# Patient Record
Sex: Male | Born: 1975 | Race: White | Hispanic: No | Marital: Married | Smoking: Never smoker
Health system: Southern US, Community
[De-identification: ages and names within clinical notes are randomized; demographics above are authoritative.]

## PROBLEM LIST (undated history)

## (undated) DIAGNOSIS — J45909 Unspecified asthma, uncomplicated: Secondary | ICD-10-CM

## (undated) DIAGNOSIS — T7840XA Allergy, unspecified, initial encounter: Secondary | ICD-10-CM

## (undated) DIAGNOSIS — F419 Anxiety disorder, unspecified: Secondary | ICD-10-CM

## (undated) DIAGNOSIS — Z86010 Personal history of colonic polyps: Secondary | ICD-10-CM

## (undated) HISTORY — PX: COLONOSCOPY: SHX174

## (undated) HISTORY — DX: Personal history of colonic polyps: Z86.010

## (undated) HISTORY — PX: POLYPECTOMY: SHX149

## (undated) HISTORY — PX: BUNIONECTOMY: SHX129

## (undated) HISTORY — DX: Unspecified asthma, uncomplicated: J45.909

## (undated) HISTORY — DX: Anxiety disorder, unspecified: F41.9

## (undated) HISTORY — DX: Allergy, unspecified, initial encounter: T78.40XA

---

## 2013-06-24 HISTORY — PX: COLONOSCOPY W/ POLYPECTOMY: SHX1380

## 2014-03-22 ENCOUNTER — Encounter: Payer: Self-pay | Admitting: Internal Medicine

## 2014-05-24 ENCOUNTER — Encounter: Payer: Self-pay | Admitting: Internal Medicine

## 2014-05-24 ENCOUNTER — Ambulatory Visit (INDEPENDENT_AMBULATORY_CARE_PROVIDER_SITE_OTHER): Payer: Managed Care, Other (non HMO) | Admitting: Internal Medicine

## 2014-05-24 VITALS — BP 112/76 | HR 80 | Ht 70.5 in | Wt 198.0 lb

## 2014-05-24 DIAGNOSIS — Z8371 Family history of colonic polyps: Secondary | ICD-10-CM

## 2014-05-24 DIAGNOSIS — K648 Other hemorrhoids: Secondary | ICD-10-CM

## 2014-05-24 DIAGNOSIS — Z1211 Encounter for screening for malignant neoplasm of colon: Secondary | ICD-10-CM

## 2014-05-24 DIAGNOSIS — Z83719 Family history of colon polyps, unspecified: Secondary | ICD-10-CM

## 2014-05-24 NOTE — Progress Notes (Signed)
Referred by Dr. Ardeth Perfect Subjective:    Patient ID: Spencer Wade, male    DOB: 01-Oct-1975, 38 y.o.   MRN: [id number]  HPI The patient is a pleasant 38 year old white man who has a father that had precancerous polyps diagnosed in his 40s and a sister who is had 1 or 2 colonoscopies and is only 76 and is certain she has had precancerous colon polyps. He is here to discuss indications for colonoscopy. He has no active GI symptoms but about a year ago he had anal itching and bright red blood on the toilet paper. He has undertaken mountain biking in the past couple of years. He does not sit for prolonged periods of time on the toilet and denies straining or constipation problems. His GI review of systems is otherwise negative. There is no strong family history of uterine, prostate or breast cancer. No Known Allergies Current outpatient prescriptions: AMBULATORY NON FORMULARY MEDICATION, Allergy injections Once weekly, Disp: , Rfl: ;  escitalopram (LEXAPRO) 10 MG tablet, Take 10 mg by mouth at bedtime. , Disp: , Rfl: ;  temazepam (RESTORIL) 15 MG capsule, Take 15 mg by mouth at bedtime. , Disp: , Rfl:   Past Medical History  Diagnosis Date  . Asthma   . Anxiety    Past Surgical History  Procedure Laterality Date  . Bunionectomy Right    History   Social History  . Marital Status: Legally Separated    Spouse Name: N/A    Number of Children: 0       Occupational History  . financial advisor    Social History Main Topics  . Smoking status: Never Smoker   . Smokeless tobacco: Never Used  . Alcohol Use: 0.0 oz/week    0 Not specified per week     Comment: wine with dinner several nights per week  . Drug Use: No  . Sexual Activity: None   Other Topics Concern  . None   Social History Narrative   05/24/2014 updated   Currently separated, he is a Music therapist, one caffeinated beverage daily.      Family History  Problem Relation Age of Onset  . Lung cancer Paternal Grandfather    . Lymphoma Paternal Grandmother   . Heart failure Paternal Grandmother   . Hypertension Paternal Grandmother   . Macular degeneration Paternal Grandmother   . Alzheimer's disease Paternal Grandfather   . Lung cancer Paternal Grandmother   . Stroke Paternal Grandmother   . Hypertension Father   . Colon polyps Father   . Hyperlipidemia Mother   . Thyroid disease Mother   . Colon polyps Sister   . Asthma Sister    Review of Systems Positive for some anxiety and insomnia. Otherwise as per history of present illness.    Objective:   Physical Exam General:  NAD Eyes:   anicteric Lungs:  clear Heart:  S1S2 no rubs, murmurs or gallops Abdomen:  soft and nontender, BS+ Ext:   no edema    Assessment & Plan:   1. Family history of colonic polyps   2. Colon cancer screening   3. Hemorrhoids, internal, with bleeding    1. Schedule a screening colonoscopy. I think this is sensible. I've asked him to try to obtain records from his sister and possibly his father though history of precancerous polyps in them in their 80s or 40s is convincing to me.The risks and benefits as well as alternatives of endoscopic procedure(s) have been discussed and reviewed. All questions answered.  The patient agrees to proceed. 2. His clinical scenario of prior rectal bleeding and anal itching is compatible with internal hemorrhoids. Will examine for other lesions at his screening colonoscopy.  I appreciate the opportunity to care for this patient. CC: Velna Hatchet, MD

## 2014-05-24 NOTE — Patient Instructions (Signed)
You have been scheduled for a colonoscopy. Please follow written instructions given to you at your visit today.  Please pick up your prep supplies at the pharmacy. If you use inhalers (even only as needed), please bring them with you on the day of your procedure. Your physician has requested that you go to www.startemmi.com and enter the access code given to you at your visit today. This web site gives a general overview about your procedure. However, you should still follow specific instructions given to you by our office regarding your preparation for the procedure.   I appreciate the opportunity to care for you.  

## 2014-05-30 ENCOUNTER — Encounter: Payer: Self-pay | Admitting: Internal Medicine

## 2014-06-09 ENCOUNTER — Ambulatory Visit (AMBULATORY_SURGERY_CENTER): Payer: Managed Care, Other (non HMO) | Admitting: Internal Medicine

## 2014-06-09 ENCOUNTER — Encounter: Payer: Self-pay | Admitting: Internal Medicine

## 2014-06-09 VITALS — BP 114/73 | HR 51 | Temp 97.7°F | Resp 15 | Ht 70.0 in | Wt 198.0 lb

## 2014-06-09 DIAGNOSIS — Z1211 Encounter for screening for malignant neoplasm of colon: Secondary | ICD-10-CM

## 2014-06-09 DIAGNOSIS — Z83719 Family history of colon polyps, unspecified: Secondary | ICD-10-CM

## 2014-06-09 DIAGNOSIS — Z8371 Family history of colonic polyps: Secondary | ICD-10-CM

## 2014-06-09 DIAGNOSIS — D128 Benign neoplasm of rectum: Secondary | ICD-10-CM

## 2014-06-09 DIAGNOSIS — D129 Benign neoplasm of anus and anal canal: Secondary | ICD-10-CM

## 2014-06-09 DIAGNOSIS — D125 Benign neoplasm of sigmoid colon: Secondary | ICD-10-CM

## 2014-06-09 DIAGNOSIS — D124 Benign neoplasm of descending colon: Secondary | ICD-10-CM

## 2014-06-09 MED ORDER — SODIUM CHLORIDE 0.9 % IV SOLN: 500.0000 mL | INTRAVENOUS | Status: DC

## 2014-06-09 NOTE — Progress Notes (Signed)
Called to room to assist during endoscopic procedure.  Patient ID and intended procedure confirmed with present staff. Received instructions for my participation in the procedure from the performing physician.  

## 2014-06-09 NOTE — Progress Notes (Signed)
Patient awakening,vss,report to rn 

## 2014-06-09 NOTE — Op Note (Signed)
[place]  [address] [place], [address]   COLONOSCOPY PROCEDURE REPORT  PATIENT: Spencer Wade, Spencer Wade  MR#: [id number] BIRTHDATE: [birth year] , 38  yrs. old GENDER: male ENDOSCOPIST: Gatha Mayer, MD, [facility]: Lambert Rhonda W PROCEDURE DATE:  06/09/2014 PROCEDURE:   Colonoscopy with snare polypectomy First Screening Colonoscopy - Avg.  risk and is 50 yrs.  old or older Yes.  Prior Negative Screening - Now for repeat screening. N/A  History of Adenoma - Now for follow-up colonoscopy & has been > or = to 3 yrs.  N/A  Polyps Removed Today? Yes. ASA CLASS:   Class II INDICATIONS:first colonoscopy and patient's family history of colon polyps. MEDICATIONS: Propofol 350 mg IV and Monitored anesthesia care  DESCRIPTION OF PROCEDURE:   After the risks benefits and alternatives of the procedure were thoroughly explained, informed consent was obtained.  The digital rectal exam revealed no abnormalities of the rectum, revealed no prostatic nodules, and revealed the prostate was not enlarged.   The LB PFC-H190 [id number] endoscope was introduced through the anus and advanced to the cecum, which was identified by both the appendix and ileocecal valve. No adverse events experienced.   The quality of the prep was excellent, using MiraLax  The instrument was then slowly withdrawn as the colon was fully examined.      COLON FINDINGS: Three sessile polyps ranging from 2 to 27mm in size were found.  Polypectomies were performed with a cold snare.  The resection was complete, the polyp tissue was completely retrieved and sent to histology.   The examination was otherwise normal. Retroflexed views revealed no abnormalities. The time to cecum=2 minutes 36 seconds.  Withdrawal time=12 minutes 08 seconds.  The scope was withdrawn and the procedure completed. COMPLICATIONS: There were no immediate complications.  ENDOSCOPIC IMPRESSION: 1.   Three sessile polyps ranging from 2 to 27mm in size were  found; polypectomies were performed with a cold snare 2.   The examination was otherwise normal - excellent prep - first screening - FHx precancerous polyps in sister  RECOMMENDATIONS: Timing of repeat colonoscopy will be determined by pathology findings.  eSigned:  Gatha Mayer, MD, [facility] 06/09/2014 9:41 AM   cc: The Patient  and Velna Hatchet, MD

## 2014-06-09 NOTE — Patient Instructions (Addendum)
I found and removed 3 small polyps. I will let you know pathology results and when to have another routine colonoscopy by mail.  I appreciate the opportunity to care for you. Gatha Mayer, MD, FACG  YOU HAD AN ENDOSCOPIC PROCEDURE TODAY AT [facility]: Refer to the procedure report that was given to you for any specific questions about what was found during the examination.  If the procedure report does not answer your questions, please call your gastroenterologist to clarify.  If you requested that your care partner not be given the details of your procedure findings, then the procedure report has been included in a sealed envelope for you to review at your convenience later.  YOU SHOULD EXPECT: Some feelings of bloating in the abdomen. Passage of more gas than usual.  Walking can help get rid of the air that was put into your GI tract during the procedure and reduce the bloating. If you had a lower endoscopy (such as a colonoscopy or flexible sigmoidoscopy) you may notice spotting of blood in your stool or on the toilet paper. If you underwent a bowel prep for your procedure, then you may not have a normal bowel movement for a few days.  DIET: Your first meal following the procedure should be a light meal and then it is ok to progress to your normal diet.  A half-sandwich or bowl of soup is an example of a good first meal.  Heavy or fried foods are harder to digest and may make you feel nauseous or bloated.  Likewise meals heavy in dairy and vegetables can cause extra gas to form and this can also increase the bloating.  Drink plenty of fluids but you should avoid alcoholic beverages for 24 hours.  ACTIVITY: Your care partner should take you home directly after the procedure.  You should plan to take it easy, moving slowly for the rest of the day.  You can resume normal activity the day after the procedure however you should NOT DRIVE or use heavy machinery for 24 hours  (because of the sedation medicines used during the test).    SYMPTOMS TO REPORT IMMEDIATELY: A gastroenterologist can be reached at any hour.  During normal business hours, 8:30 AM to 5:00 PM Monday through Friday, call [phone].  After hours and on weekends, please call the GI answering service at [phone] who will take a message and have the physician on call contact you.   Following lower endoscopy (colonoscopy or flexible sigmoidoscopy):  Excessive amounts of blood in the stool  Significant tenderness or worsening of abdominal pains  Swelling of the abdomen that is new, acute  Fever of 100F or higher  FOLLOW UP: If any biopsies were taken you will be contacted by phone or by letter within the next 1-3 weeks.  Call your gastroenterologist if you have not heard about the biopsies in 3 weeks.  Our staff will call the home number listed on your records the next business day following your procedure to check on you and address any questions or concerns that you may have at that time regarding the information given to you following your procedure. This is a courtesy call and so if there is no answer at the home number and we have not heard from you through the emergency physician on call, we will assume that you have returned to your regular daily activities without incident.  SIGNATURES/CONFIDENTIALITY: You and/or your care partner have signed paperwork  which will be entered into your electronic medical record.  These signatures attest to the fact that that the information above on your After Visit Summary has been reviewed and is understood.  Full responsibility of the confidentiality of this discharge information lies with you and/or your care-partner.  Await pathology  Continue your normal medications  Please read handout about polyps

## 2014-06-10 ENCOUNTER — Telehealth: Payer: Self-pay | Admitting: *Deleted

## 2014-06-10 NOTE — Telephone Encounter (Signed)
  Follow up Call-  Call back number 06/09/2014  Post procedure Call Back phone  # [phone]  Permission to leave phone message Yes    [facility]

## 2014-06-14 ENCOUNTER — Encounter: Payer: Self-pay | Admitting: Internal Medicine

## 2014-06-14 DIAGNOSIS — Z860101 Personal history of adenomatous and serrated colon polyps: Secondary | ICD-10-CM | POA: Insufficient documentation

## 2014-06-14 DIAGNOSIS — Z86010 Personal history of colonic polyps: Secondary | ICD-10-CM

## 2014-06-14 HISTORY — DX: Personal history of colonic polyps: Z86.010

## 2014-06-14 NOTE — Progress Notes (Signed)
Quick Note:  2 diminutive adenomas repeat colonoscopy 2021 ______

## 2016-06-25 DIAGNOSIS — D485 Neoplasm of uncertain behavior of skin: Secondary | ICD-10-CM | POA: Diagnosis not present

## 2016-06-26 DIAGNOSIS — Z3141 Encounter for fertility testing: Secondary | ICD-10-CM | POA: Diagnosis not present

## 2016-07-02 DIAGNOSIS — J3081 Allergic rhinitis due to animal (cat) (dog) hair and dander: Secondary | ICD-10-CM | POA: Diagnosis not present

## 2016-07-02 DIAGNOSIS — J301 Allergic rhinitis due to pollen: Secondary | ICD-10-CM | POA: Diagnosis not present

## 2016-07-02 DIAGNOSIS — J3089 Other allergic rhinitis: Secondary | ICD-10-CM | POA: Diagnosis not present

## 2016-07-19 DIAGNOSIS — H1132 Conjunctival hemorrhage, left eye: Secondary | ICD-10-CM | POA: Diagnosis not present

## 2016-08-06 DIAGNOSIS — J301 Allergic rhinitis due to pollen: Secondary | ICD-10-CM | POA: Diagnosis not present

## 2016-08-06 DIAGNOSIS — J3089 Other allergic rhinitis: Secondary | ICD-10-CM | POA: Diagnosis not present

## 2016-08-06 DIAGNOSIS — J3081 Allergic rhinitis due to animal (cat) (dog) hair and dander: Secondary | ICD-10-CM | POA: Diagnosis not present

## 2016-08-13 DIAGNOSIS — Z713 Dietary counseling and surveillance: Secondary | ICD-10-CM | POA: Diagnosis not present

## 2016-08-13 DIAGNOSIS — J3089 Other allergic rhinitis: Secondary | ICD-10-CM | POA: Diagnosis not present

## 2016-08-13 DIAGNOSIS — J3081 Allergic rhinitis due to animal (cat) (dog) hair and dander: Secondary | ICD-10-CM | POA: Diagnosis not present

## 2016-08-13 DIAGNOSIS — J301 Allergic rhinitis due to pollen: Secondary | ICD-10-CM | POA: Diagnosis not present

## 2016-08-13 DIAGNOSIS — Z13220 Encounter for screening for lipoid disorders: Secondary | ICD-10-CM | POA: Diagnosis not present

## 2016-08-13 DIAGNOSIS — Z136 Encounter for screening for cardiovascular disorders: Secondary | ICD-10-CM | POA: Diagnosis not present

## 2016-08-13 DIAGNOSIS — Z6828 Body mass index (BMI) 28.0-28.9, adult: Secondary | ICD-10-CM | POA: Diagnosis not present

## 2016-08-15 DIAGNOSIS — J3089 Other allergic rhinitis: Secondary | ICD-10-CM | POA: Diagnosis not present

## 2016-08-15 DIAGNOSIS — J3081 Allergic rhinitis due to animal (cat) (dog) hair and dander: Secondary | ICD-10-CM | POA: Diagnosis not present

## 2016-08-15 DIAGNOSIS — J301 Allergic rhinitis due to pollen: Secondary | ICD-10-CM | POA: Diagnosis not present

## 2016-08-22 DIAGNOSIS — J3081 Allergic rhinitis due to animal (cat) (dog) hair and dander: Secondary | ICD-10-CM | POA: Diagnosis not present

## 2016-08-22 DIAGNOSIS — J301 Allergic rhinitis due to pollen: Secondary | ICD-10-CM | POA: Diagnosis not present

## 2016-08-22 DIAGNOSIS — J3089 Other allergic rhinitis: Secondary | ICD-10-CM | POA: Diagnosis not present

## 2016-09-09 DIAGNOSIS — J3081 Allergic rhinitis due to animal (cat) (dog) hair and dander: Secondary | ICD-10-CM | POA: Diagnosis not present

## 2016-09-09 DIAGNOSIS — J3089 Other allergic rhinitis: Secondary | ICD-10-CM | POA: Diagnosis not present

## 2016-09-09 DIAGNOSIS — J301 Allergic rhinitis due to pollen: Secondary | ICD-10-CM | POA: Diagnosis not present

## 2016-09-27 DIAGNOSIS — J3081 Allergic rhinitis due to animal (cat) (dog) hair and dander: Secondary | ICD-10-CM | POA: Diagnosis not present

## 2016-09-27 DIAGNOSIS — J301 Allergic rhinitis due to pollen: Secondary | ICD-10-CM | POA: Diagnosis not present

## 2016-09-27 DIAGNOSIS — J3089 Other allergic rhinitis: Secondary | ICD-10-CM | POA: Diagnosis not present

## 2016-09-30 DIAGNOSIS — J3081 Allergic rhinitis due to animal (cat) (dog) hair and dander: Secondary | ICD-10-CM | POA: Diagnosis not present

## 2016-09-30 DIAGNOSIS — J3089 Other allergic rhinitis: Secondary | ICD-10-CM | POA: Diagnosis not present

## 2016-09-30 DIAGNOSIS — J301 Allergic rhinitis due to pollen: Secondary | ICD-10-CM | POA: Diagnosis not present

## 2016-09-30 DIAGNOSIS — Z113 Encounter for screening for infections with a predominantly sexual mode of transmission: Secondary | ICD-10-CM | POA: Diagnosis not present

## 2016-10-01 DIAGNOSIS — J3089 Other allergic rhinitis: Secondary | ICD-10-CM | POA: Diagnosis not present

## 2016-10-01 DIAGNOSIS — J3081 Allergic rhinitis due to animal (cat) (dog) hair and dander: Secondary | ICD-10-CM | POA: Diagnosis not present

## 2016-10-01 DIAGNOSIS — J4520 Mild intermittent asthma, uncomplicated: Secondary | ICD-10-CM | POA: Diagnosis not present

## 2016-10-01 DIAGNOSIS — J301 Allergic rhinitis due to pollen: Secondary | ICD-10-CM | POA: Diagnosis not present

## 2016-10-09 DIAGNOSIS — J301 Allergic rhinitis due to pollen: Secondary | ICD-10-CM | POA: Diagnosis not present

## 2016-10-09 DIAGNOSIS — J3081 Allergic rhinitis due to animal (cat) (dog) hair and dander: Secondary | ICD-10-CM | POA: Diagnosis not present

## 2016-10-09 DIAGNOSIS — J3089 Other allergic rhinitis: Secondary | ICD-10-CM | POA: Diagnosis not present

## 2016-10-25 DIAGNOSIS — Z3189 Encounter for other procreative management: Secondary | ICD-10-CM | POA: Diagnosis not present

## 2016-11-14 DIAGNOSIS — J301 Allergic rhinitis due to pollen: Secondary | ICD-10-CM | POA: Diagnosis not present

## 2016-11-14 DIAGNOSIS — J3081 Allergic rhinitis due to animal (cat) (dog) hair and dander: Secondary | ICD-10-CM | POA: Diagnosis not present

## 2016-11-14 DIAGNOSIS — J3089 Other allergic rhinitis: Secondary | ICD-10-CM | POA: Diagnosis not present

## 2016-11-22 DIAGNOSIS — J301 Allergic rhinitis due to pollen: Secondary | ICD-10-CM | POA: Diagnosis not present

## 2016-11-22 DIAGNOSIS — J3081 Allergic rhinitis due to animal (cat) (dog) hair and dander: Secondary | ICD-10-CM | POA: Diagnosis not present

## 2016-11-22 DIAGNOSIS — J3089 Other allergic rhinitis: Secondary | ICD-10-CM | POA: Diagnosis not present

## 2016-12-23 DIAGNOSIS — J3089 Other allergic rhinitis: Secondary | ICD-10-CM | POA: Diagnosis not present

## 2016-12-23 DIAGNOSIS — J301 Allergic rhinitis due to pollen: Secondary | ICD-10-CM | POA: Diagnosis not present

## 2016-12-23 DIAGNOSIS — J3081 Allergic rhinitis due to animal (cat) (dog) hair and dander: Secondary | ICD-10-CM | POA: Diagnosis not present

## 2017-01-30 DIAGNOSIS — J3081 Allergic rhinitis due to animal (cat) (dog) hair and dander: Secondary | ICD-10-CM | POA: Diagnosis not present

## 2017-01-30 DIAGNOSIS — J3089 Other allergic rhinitis: Secondary | ICD-10-CM | POA: Diagnosis not present

## 2017-01-30 DIAGNOSIS — J301 Allergic rhinitis due to pollen: Secondary | ICD-10-CM | POA: Diagnosis not present

## 2017-02-12 DIAGNOSIS — J3081 Allergic rhinitis due to animal (cat) (dog) hair and dander: Secondary | ICD-10-CM | POA: Diagnosis not present

## 2017-02-12 DIAGNOSIS — J3089 Other allergic rhinitis: Secondary | ICD-10-CM | POA: Diagnosis not present

## 2017-02-12 DIAGNOSIS — J301 Allergic rhinitis due to pollen: Secondary | ICD-10-CM | POA: Diagnosis not present

## 2017-02-17 DIAGNOSIS — J301 Allergic rhinitis due to pollen: Secondary | ICD-10-CM | POA: Diagnosis not present

## 2017-02-17 DIAGNOSIS — J3089 Other allergic rhinitis: Secondary | ICD-10-CM | POA: Diagnosis not present

## 2017-02-17 DIAGNOSIS — J3081 Allergic rhinitis due to animal (cat) (dog) hair and dander: Secondary | ICD-10-CM | POA: Diagnosis not present

## 2017-02-20 DIAGNOSIS — J3081 Allergic rhinitis due to animal (cat) (dog) hair and dander: Secondary | ICD-10-CM | POA: Diagnosis not present

## 2017-02-20 DIAGNOSIS — J301 Allergic rhinitis due to pollen: Secondary | ICD-10-CM | POA: Diagnosis not present

## 2017-02-20 DIAGNOSIS — J3089 Other allergic rhinitis: Secondary | ICD-10-CM | POA: Diagnosis not present

## 2017-02-27 DIAGNOSIS — Z3189 Encounter for other procreative management: Secondary | ICD-10-CM | POA: Diagnosis not present

## 2017-03-06 DIAGNOSIS — J301 Allergic rhinitis due to pollen: Secondary | ICD-10-CM | POA: Diagnosis not present

## 2017-03-06 DIAGNOSIS — J3081 Allergic rhinitis due to animal (cat) (dog) hair and dander: Secondary | ICD-10-CM | POA: Diagnosis not present

## 2017-03-06 DIAGNOSIS — J3089 Other allergic rhinitis: Secondary | ICD-10-CM | POA: Diagnosis not present

## 2017-03-21 DIAGNOSIS — J301 Allergic rhinitis due to pollen: Secondary | ICD-10-CM | POA: Diagnosis not present

## 2017-03-21 DIAGNOSIS — J3089 Other allergic rhinitis: Secondary | ICD-10-CM | POA: Diagnosis not present

## 2017-03-21 DIAGNOSIS — J3081 Allergic rhinitis due to animal (cat) (dog) hair and dander: Secondary | ICD-10-CM | POA: Diagnosis not present

## 2017-03-26 DIAGNOSIS — J3081 Allergic rhinitis due to animal (cat) (dog) hair and dander: Secondary | ICD-10-CM | POA: Diagnosis not present

## 2017-03-26 DIAGNOSIS — J301 Allergic rhinitis due to pollen: Secondary | ICD-10-CM | POA: Diagnosis not present

## 2017-03-26 DIAGNOSIS — J3089 Other allergic rhinitis: Secondary | ICD-10-CM | POA: Diagnosis not present

## 2017-04-16 DIAGNOSIS — J301 Allergic rhinitis due to pollen: Secondary | ICD-10-CM | POA: Diagnosis not present

## 2017-04-16 DIAGNOSIS — J3089 Other allergic rhinitis: Secondary | ICD-10-CM | POA: Diagnosis not present

## 2017-04-16 DIAGNOSIS — J3081 Allergic rhinitis due to animal (cat) (dog) hair and dander: Secondary | ICD-10-CM | POA: Diagnosis not present

## 2017-04-23 DIAGNOSIS — J301 Allergic rhinitis due to pollen: Secondary | ICD-10-CM | POA: Diagnosis not present

## 2017-04-23 DIAGNOSIS — J3081 Allergic rhinitis due to animal (cat) (dog) hair and dander: Secondary | ICD-10-CM | POA: Diagnosis not present

## 2017-04-23 DIAGNOSIS — J3089 Other allergic rhinitis: Secondary | ICD-10-CM | POA: Diagnosis not present

## 2017-05-02 DIAGNOSIS — J301 Allergic rhinitis due to pollen: Secondary | ICD-10-CM | POA: Diagnosis not present

## 2017-05-02 DIAGNOSIS — J3081 Allergic rhinitis due to animal (cat) (dog) hair and dander: Secondary | ICD-10-CM | POA: Diagnosis not present

## 2017-05-02 DIAGNOSIS — J3089 Other allergic rhinitis: Secondary | ICD-10-CM | POA: Diagnosis not present

## 2017-05-05 DIAGNOSIS — J3089 Other allergic rhinitis: Secondary | ICD-10-CM | POA: Diagnosis not present

## 2017-05-05 DIAGNOSIS — J301 Allergic rhinitis due to pollen: Secondary | ICD-10-CM | POA: Diagnosis not present

## 2017-05-05 DIAGNOSIS — J3081 Allergic rhinitis due to animal (cat) (dog) hair and dander: Secondary | ICD-10-CM | POA: Diagnosis not present

## 2017-05-07 DIAGNOSIS — F418 Other specified anxiety disorders: Secondary | ICD-10-CM | POA: Diagnosis not present

## 2017-05-07 DIAGNOSIS — J301 Allergic rhinitis due to pollen: Secondary | ICD-10-CM | POA: Diagnosis not present

## 2017-05-07 DIAGNOSIS — Z0000 Encounter for general adult medical examination without abnormal findings: Secondary | ICD-10-CM | POA: Diagnosis not present

## 2017-05-07 DIAGNOSIS — R030 Elevated blood-pressure reading, without diagnosis of hypertension: Secondary | ICD-10-CM | POA: Diagnosis not present

## 2017-05-07 DIAGNOSIS — J3081 Allergic rhinitis due to animal (cat) (dog) hair and dander: Secondary | ICD-10-CM | POA: Diagnosis not present

## 2017-05-07 DIAGNOSIS — Z125 Encounter for screening for malignant neoplasm of prostate: Secondary | ICD-10-CM | POA: Diagnosis not present

## 2017-05-08 DIAGNOSIS — J3089 Other allergic rhinitis: Secondary | ICD-10-CM | POA: Diagnosis not present

## 2017-05-20 DIAGNOSIS — J3089 Other allergic rhinitis: Secondary | ICD-10-CM | POA: Diagnosis not present

## 2017-05-20 DIAGNOSIS — J3081 Allergic rhinitis due to animal (cat) (dog) hair and dander: Secondary | ICD-10-CM | POA: Diagnosis not present

## 2017-05-20 DIAGNOSIS — J301 Allergic rhinitis due to pollen: Secondary | ICD-10-CM | POA: Diagnosis not present

## 2017-05-27 DIAGNOSIS — L309 Dermatitis, unspecified: Secondary | ICD-10-CM | POA: Diagnosis not present

## 2017-05-27 DIAGNOSIS — D225 Melanocytic nevi of trunk: Secondary | ICD-10-CM | POA: Diagnosis not present

## 2017-05-27 DIAGNOSIS — L821 Other seborrheic keratosis: Secondary | ICD-10-CM | POA: Diagnosis not present

## 2017-05-27 DIAGNOSIS — D224 Melanocytic nevi of scalp and neck: Secondary | ICD-10-CM | POA: Diagnosis not present

## 2017-05-29 DIAGNOSIS — Z1389 Encounter for screening for other disorder: Secondary | ICD-10-CM | POA: Diagnosis not present

## 2017-05-29 DIAGNOSIS — Z800 Family history of malignant neoplasm of digestive organs: Secondary | ICD-10-CM | POA: Diagnosis not present

## 2017-05-29 DIAGNOSIS — Z0000 Encounter for general adult medical examination without abnormal findings: Secondary | ICD-10-CM | POA: Diagnosis not present

## 2017-05-29 DIAGNOSIS — Z86010 Personal history of colonic polyps: Secondary | ICD-10-CM | POA: Diagnosis not present

## 2017-05-29 DIAGNOSIS — R51 Headache: Secondary | ICD-10-CM | POA: Diagnosis not present

## 2017-05-29 DIAGNOSIS — J301 Allergic rhinitis due to pollen: Secondary | ICD-10-CM | POA: Diagnosis not present

## 2017-05-29 DIAGNOSIS — J3081 Allergic rhinitis due to animal (cat) (dog) hair and dander: Secondary | ICD-10-CM | POA: Diagnosis not present

## 2017-05-29 DIAGNOSIS — J3089 Other allergic rhinitis: Secondary | ICD-10-CM | POA: Diagnosis not present

## 2017-05-29 DIAGNOSIS — R030 Elevated blood-pressure reading, without diagnosis of hypertension: Secondary | ICD-10-CM | POA: Diagnosis not present

## 2017-06-06 DIAGNOSIS — Z1212 Encounter for screening for malignant neoplasm of rectum: Secondary | ICD-10-CM | POA: Diagnosis not present

## 2017-06-10 ENCOUNTER — Encounter: Payer: Self-pay | Admitting: Internal Medicine

## 2017-06-25 DIAGNOSIS — J3081 Allergic rhinitis due to animal (cat) (dog) hair and dander: Secondary | ICD-10-CM | POA: Diagnosis not present

## 2017-06-25 DIAGNOSIS — J3089 Other allergic rhinitis: Secondary | ICD-10-CM | POA: Diagnosis not present

## 2017-06-25 DIAGNOSIS — J301 Allergic rhinitis due to pollen: Secondary | ICD-10-CM | POA: Diagnosis not present

## 2017-07-02 DIAGNOSIS — J301 Allergic rhinitis due to pollen: Secondary | ICD-10-CM | POA: Diagnosis not present

## 2017-07-02 DIAGNOSIS — J3089 Other allergic rhinitis: Secondary | ICD-10-CM | POA: Diagnosis not present

## 2017-07-02 DIAGNOSIS — J3081 Allergic rhinitis due to animal (cat) (dog) hair and dander: Secondary | ICD-10-CM | POA: Diagnosis not present

## 2017-07-30 DIAGNOSIS — J301 Allergic rhinitis due to pollen: Secondary | ICD-10-CM | POA: Diagnosis not present

## 2017-07-30 DIAGNOSIS — J3089 Other allergic rhinitis: Secondary | ICD-10-CM | POA: Diagnosis not present

## 2017-07-30 DIAGNOSIS — J3081 Allergic rhinitis due to animal (cat) (dog) hair and dander: Secondary | ICD-10-CM | POA: Diagnosis not present

## 2017-08-06 DIAGNOSIS — J3089 Other allergic rhinitis: Secondary | ICD-10-CM | POA: Diagnosis not present

## 2017-08-06 DIAGNOSIS — J301 Allergic rhinitis due to pollen: Secondary | ICD-10-CM | POA: Diagnosis not present

## 2017-08-06 DIAGNOSIS — J3081 Allergic rhinitis due to animal (cat) (dog) hair and dander: Secondary | ICD-10-CM | POA: Diagnosis not present

## 2017-08-06 DIAGNOSIS — J4520 Mild intermittent asthma, uncomplicated: Secondary | ICD-10-CM | POA: Diagnosis not present

## 2017-09-05 DIAGNOSIS — J3081 Allergic rhinitis due to animal (cat) (dog) hair and dander: Secondary | ICD-10-CM | POA: Diagnosis not present

## 2017-09-05 DIAGNOSIS — J3089 Other allergic rhinitis: Secondary | ICD-10-CM | POA: Diagnosis not present

## 2017-09-05 DIAGNOSIS — J301 Allergic rhinitis due to pollen: Secondary | ICD-10-CM | POA: Diagnosis not present

## 2017-09-10 DIAGNOSIS — J3089 Other allergic rhinitis: Secondary | ICD-10-CM | POA: Diagnosis not present

## 2017-09-10 DIAGNOSIS — J3081 Allergic rhinitis due to animal (cat) (dog) hair and dander: Secondary | ICD-10-CM | POA: Diagnosis not present

## 2017-09-10 DIAGNOSIS — J301 Allergic rhinitis due to pollen: Secondary | ICD-10-CM | POA: Diagnosis not present

## 2017-09-12 DIAGNOSIS — J3089 Other allergic rhinitis: Secondary | ICD-10-CM | POA: Diagnosis not present

## 2017-09-12 DIAGNOSIS — J301 Allergic rhinitis due to pollen: Secondary | ICD-10-CM | POA: Diagnosis not present

## 2017-09-12 DIAGNOSIS — L700 Acne vulgaris: Secondary | ICD-10-CM | POA: Diagnosis not present

## 2017-09-24 DIAGNOSIS — J3081 Allergic rhinitis due to animal (cat) (dog) hair and dander: Secondary | ICD-10-CM | POA: Diagnosis not present

## 2017-09-24 DIAGNOSIS — J301 Allergic rhinitis due to pollen: Secondary | ICD-10-CM | POA: Diagnosis not present

## 2017-09-24 DIAGNOSIS — J3089 Other allergic rhinitis: Secondary | ICD-10-CM | POA: Diagnosis not present

## 2017-10-06 DIAGNOSIS — J3089 Other allergic rhinitis: Secondary | ICD-10-CM | POA: Diagnosis not present

## 2017-10-06 DIAGNOSIS — J301 Allergic rhinitis due to pollen: Secondary | ICD-10-CM | POA: Diagnosis not present

## 2017-10-06 DIAGNOSIS — J3081 Allergic rhinitis due to animal (cat) (dog) hair and dander: Secondary | ICD-10-CM | POA: Diagnosis not present

## 2017-10-20 DIAGNOSIS — J301 Allergic rhinitis due to pollen: Secondary | ICD-10-CM | POA: Diagnosis not present

## 2017-10-20 DIAGNOSIS — J3081 Allergic rhinitis due to animal (cat) (dog) hair and dander: Secondary | ICD-10-CM | POA: Diagnosis not present

## 2017-10-20 DIAGNOSIS — J3089 Other allergic rhinitis: Secondary | ICD-10-CM | POA: Diagnosis not present

## 2017-10-29 DIAGNOSIS — J3081 Allergic rhinitis due to animal (cat) (dog) hair and dander: Secondary | ICD-10-CM | POA: Diagnosis not present

## 2017-10-29 DIAGNOSIS — J301 Allergic rhinitis due to pollen: Secondary | ICD-10-CM | POA: Diagnosis not present

## 2017-10-29 DIAGNOSIS — J3089 Other allergic rhinitis: Secondary | ICD-10-CM | POA: Diagnosis not present

## 2017-10-30 DIAGNOSIS — M9902 Segmental and somatic dysfunction of thoracic region: Secondary | ICD-10-CM | POA: Diagnosis not present

## 2017-10-30 DIAGNOSIS — M542 Cervicalgia: Secondary | ICD-10-CM | POA: Diagnosis not present

## 2017-10-30 DIAGNOSIS — M546 Pain in thoracic spine: Secondary | ICD-10-CM | POA: Diagnosis not present

## 2017-10-30 DIAGNOSIS — M9901 Segmental and somatic dysfunction of cervical region: Secondary | ICD-10-CM | POA: Diagnosis not present

## 2017-10-31 DIAGNOSIS — M9902 Segmental and somatic dysfunction of thoracic region: Secondary | ICD-10-CM | POA: Diagnosis not present

## 2017-10-31 DIAGNOSIS — J3081 Allergic rhinitis due to animal (cat) (dog) hair and dander: Secondary | ICD-10-CM | POA: Diagnosis not present

## 2017-10-31 DIAGNOSIS — M542 Cervicalgia: Secondary | ICD-10-CM | POA: Diagnosis not present

## 2017-10-31 DIAGNOSIS — M9901 Segmental and somatic dysfunction of cervical region: Secondary | ICD-10-CM | POA: Diagnosis not present

## 2017-10-31 DIAGNOSIS — J301 Allergic rhinitis due to pollen: Secondary | ICD-10-CM | POA: Diagnosis not present

## 2017-10-31 DIAGNOSIS — M546 Pain in thoracic spine: Secondary | ICD-10-CM | POA: Diagnosis not present

## 2017-10-31 DIAGNOSIS — J3089 Other allergic rhinitis: Secondary | ICD-10-CM | POA: Diagnosis not present

## 2017-11-03 DIAGNOSIS — M542 Cervicalgia: Secondary | ICD-10-CM | POA: Diagnosis not present

## 2017-11-03 DIAGNOSIS — M9901 Segmental and somatic dysfunction of cervical region: Secondary | ICD-10-CM | POA: Diagnosis not present

## 2017-11-03 DIAGNOSIS — M546 Pain in thoracic spine: Secondary | ICD-10-CM | POA: Diagnosis not present

## 2017-11-03 DIAGNOSIS — M9902 Segmental and somatic dysfunction of thoracic region: Secondary | ICD-10-CM | POA: Diagnosis not present

## 2017-11-04 DIAGNOSIS — M9902 Segmental and somatic dysfunction of thoracic region: Secondary | ICD-10-CM | POA: Diagnosis not present

## 2017-11-04 DIAGNOSIS — M9901 Segmental and somatic dysfunction of cervical region: Secondary | ICD-10-CM | POA: Diagnosis not present

## 2017-11-04 DIAGNOSIS — M542 Cervicalgia: Secondary | ICD-10-CM | POA: Diagnosis not present

## 2017-11-04 DIAGNOSIS — J3081 Allergic rhinitis due to animal (cat) (dog) hair and dander: Secondary | ICD-10-CM | POA: Diagnosis not present

## 2017-11-04 DIAGNOSIS — J3089 Other allergic rhinitis: Secondary | ICD-10-CM | POA: Diagnosis not present

## 2017-11-04 DIAGNOSIS — J301 Allergic rhinitis due to pollen: Secondary | ICD-10-CM | POA: Diagnosis not present

## 2017-11-04 DIAGNOSIS — M546 Pain in thoracic spine: Secondary | ICD-10-CM | POA: Diagnosis not present

## 2017-11-10 DIAGNOSIS — M546 Pain in thoracic spine: Secondary | ICD-10-CM | POA: Diagnosis not present

## 2017-11-10 DIAGNOSIS — M542 Cervicalgia: Secondary | ICD-10-CM | POA: Diagnosis not present

## 2017-11-10 DIAGNOSIS — M9901 Segmental and somatic dysfunction of cervical region: Secondary | ICD-10-CM | POA: Diagnosis not present

## 2017-11-10 DIAGNOSIS — M9902 Segmental and somatic dysfunction of thoracic region: Secondary | ICD-10-CM | POA: Diagnosis not present

## 2017-11-11 DIAGNOSIS — M546 Pain in thoracic spine: Secondary | ICD-10-CM | POA: Diagnosis not present

## 2017-11-11 DIAGNOSIS — M9902 Segmental and somatic dysfunction of thoracic region: Secondary | ICD-10-CM | POA: Diagnosis not present

## 2017-11-11 DIAGNOSIS — M542 Cervicalgia: Secondary | ICD-10-CM | POA: Diagnosis not present

## 2017-11-11 DIAGNOSIS — M9901 Segmental and somatic dysfunction of cervical region: Secondary | ICD-10-CM | POA: Diagnosis not present

## 2017-11-13 DIAGNOSIS — M9901 Segmental and somatic dysfunction of cervical region: Secondary | ICD-10-CM | POA: Diagnosis not present

## 2017-11-13 DIAGNOSIS — M542 Cervicalgia: Secondary | ICD-10-CM | POA: Diagnosis not present

## 2017-11-13 DIAGNOSIS — M546 Pain in thoracic spine: Secondary | ICD-10-CM | POA: Diagnosis not present

## 2017-11-13 DIAGNOSIS — M9902 Segmental and somatic dysfunction of thoracic region: Secondary | ICD-10-CM | POA: Diagnosis not present

## 2017-11-14 DIAGNOSIS — M542 Cervicalgia: Secondary | ICD-10-CM | POA: Diagnosis not present

## 2017-11-14 DIAGNOSIS — M9901 Segmental and somatic dysfunction of cervical region: Secondary | ICD-10-CM | POA: Diagnosis not present

## 2017-11-14 DIAGNOSIS — M9902 Segmental and somatic dysfunction of thoracic region: Secondary | ICD-10-CM | POA: Diagnosis not present

## 2017-11-14 DIAGNOSIS — M546 Pain in thoracic spine: Secondary | ICD-10-CM | POA: Diagnosis not present

## 2017-11-18 DIAGNOSIS — M542 Cervicalgia: Secondary | ICD-10-CM | POA: Diagnosis not present

## 2017-11-18 DIAGNOSIS — M9901 Segmental and somatic dysfunction of cervical region: Secondary | ICD-10-CM | POA: Diagnosis not present

## 2017-11-18 DIAGNOSIS — M9902 Segmental and somatic dysfunction of thoracic region: Secondary | ICD-10-CM | POA: Diagnosis not present

## 2017-11-18 DIAGNOSIS — M546 Pain in thoracic spine: Secondary | ICD-10-CM | POA: Diagnosis not present

## 2017-11-19 DIAGNOSIS — M9901 Segmental and somatic dysfunction of cervical region: Secondary | ICD-10-CM | POA: Diagnosis not present

## 2017-11-19 DIAGNOSIS — M9902 Segmental and somatic dysfunction of thoracic region: Secondary | ICD-10-CM | POA: Diagnosis not present

## 2017-11-19 DIAGNOSIS — M542 Cervicalgia: Secondary | ICD-10-CM | POA: Diagnosis not present

## 2017-11-19 DIAGNOSIS — M546 Pain in thoracic spine: Secondary | ICD-10-CM | POA: Diagnosis not present

## 2017-11-20 DIAGNOSIS — M546 Pain in thoracic spine: Secondary | ICD-10-CM | POA: Diagnosis not present

## 2017-11-20 DIAGNOSIS — M9902 Segmental and somatic dysfunction of thoracic region: Secondary | ICD-10-CM | POA: Diagnosis not present

## 2017-11-20 DIAGNOSIS — M542 Cervicalgia: Secondary | ICD-10-CM | POA: Diagnosis not present

## 2017-11-20 DIAGNOSIS — M9901 Segmental and somatic dysfunction of cervical region: Secondary | ICD-10-CM | POA: Diagnosis not present

## 2017-11-24 DIAGNOSIS — M9902 Segmental and somatic dysfunction of thoracic region: Secondary | ICD-10-CM | POA: Diagnosis not present

## 2017-11-24 DIAGNOSIS — M9901 Segmental and somatic dysfunction of cervical region: Secondary | ICD-10-CM | POA: Diagnosis not present

## 2017-11-24 DIAGNOSIS — M546 Pain in thoracic spine: Secondary | ICD-10-CM | POA: Diagnosis not present

## 2017-11-24 DIAGNOSIS — M542 Cervicalgia: Secondary | ICD-10-CM | POA: Diagnosis not present

## 2017-11-26 DIAGNOSIS — M542 Cervicalgia: Secondary | ICD-10-CM | POA: Diagnosis not present

## 2017-11-26 DIAGNOSIS — M546 Pain in thoracic spine: Secondary | ICD-10-CM | POA: Diagnosis not present

## 2017-11-26 DIAGNOSIS — M9901 Segmental and somatic dysfunction of cervical region: Secondary | ICD-10-CM | POA: Diagnosis not present

## 2017-11-26 DIAGNOSIS — M9902 Segmental and somatic dysfunction of thoracic region: Secondary | ICD-10-CM | POA: Diagnosis not present

## 2017-11-28 DIAGNOSIS — M9902 Segmental and somatic dysfunction of thoracic region: Secondary | ICD-10-CM | POA: Diagnosis not present

## 2017-11-28 DIAGNOSIS — M542 Cervicalgia: Secondary | ICD-10-CM | POA: Diagnosis not present

## 2017-11-28 DIAGNOSIS — M546 Pain in thoracic spine: Secondary | ICD-10-CM | POA: Diagnosis not present

## 2017-11-28 DIAGNOSIS — M9901 Segmental and somatic dysfunction of cervical region: Secondary | ICD-10-CM | POA: Diagnosis not present

## 2017-12-01 DIAGNOSIS — M546 Pain in thoracic spine: Secondary | ICD-10-CM | POA: Diagnosis not present

## 2017-12-01 DIAGNOSIS — M542 Cervicalgia: Secondary | ICD-10-CM | POA: Diagnosis not present

## 2017-12-01 DIAGNOSIS — M9902 Segmental and somatic dysfunction of thoracic region: Secondary | ICD-10-CM | POA: Diagnosis not present

## 2017-12-01 DIAGNOSIS — M9901 Segmental and somatic dysfunction of cervical region: Secondary | ICD-10-CM | POA: Diagnosis not present

## 2017-12-02 DIAGNOSIS — M9901 Segmental and somatic dysfunction of cervical region: Secondary | ICD-10-CM | POA: Diagnosis not present

## 2017-12-02 DIAGNOSIS — M9902 Segmental and somatic dysfunction of thoracic region: Secondary | ICD-10-CM | POA: Diagnosis not present

## 2017-12-02 DIAGNOSIS — M542 Cervicalgia: Secondary | ICD-10-CM | POA: Diagnosis not present

## 2017-12-02 DIAGNOSIS — M546 Pain in thoracic spine: Secondary | ICD-10-CM | POA: Diagnosis not present

## 2017-12-10 DIAGNOSIS — M9902 Segmental and somatic dysfunction of thoracic region: Secondary | ICD-10-CM | POA: Diagnosis not present

## 2017-12-10 DIAGNOSIS — M546 Pain in thoracic spine: Secondary | ICD-10-CM | POA: Diagnosis not present

## 2017-12-10 DIAGNOSIS — M542 Cervicalgia: Secondary | ICD-10-CM | POA: Diagnosis not present

## 2017-12-10 DIAGNOSIS — M9901 Segmental and somatic dysfunction of cervical region: Secondary | ICD-10-CM | POA: Diagnosis not present

## 2017-12-12 DIAGNOSIS — M9902 Segmental and somatic dysfunction of thoracic region: Secondary | ICD-10-CM | POA: Diagnosis not present

## 2017-12-12 DIAGNOSIS — M546 Pain in thoracic spine: Secondary | ICD-10-CM | POA: Diagnosis not present

## 2017-12-12 DIAGNOSIS — M542 Cervicalgia: Secondary | ICD-10-CM | POA: Diagnosis not present

## 2017-12-12 DIAGNOSIS — M9901 Segmental and somatic dysfunction of cervical region: Secondary | ICD-10-CM | POA: Diagnosis not present

## 2017-12-31 DIAGNOSIS — J3089 Other allergic rhinitis: Secondary | ICD-10-CM | POA: Diagnosis not present

## 2017-12-31 DIAGNOSIS — J3081 Allergic rhinitis due to animal (cat) (dog) hair and dander: Secondary | ICD-10-CM | POA: Diagnosis not present

## 2017-12-31 DIAGNOSIS — J301 Allergic rhinitis due to pollen: Secondary | ICD-10-CM | POA: Diagnosis not present

## 2018-01-06 DIAGNOSIS — J3081 Allergic rhinitis due to animal (cat) (dog) hair and dander: Secondary | ICD-10-CM | POA: Diagnosis not present

## 2018-01-06 DIAGNOSIS — J3089 Other allergic rhinitis: Secondary | ICD-10-CM | POA: Diagnosis not present

## 2018-01-06 DIAGNOSIS — J301 Allergic rhinitis due to pollen: Secondary | ICD-10-CM | POA: Diagnosis not present

## 2018-01-12 DIAGNOSIS — J3089 Other allergic rhinitis: Secondary | ICD-10-CM | POA: Diagnosis not present

## 2018-01-12 DIAGNOSIS — J301 Allergic rhinitis due to pollen: Secondary | ICD-10-CM | POA: Diagnosis not present

## 2018-01-12 DIAGNOSIS — J3081 Allergic rhinitis due to animal (cat) (dog) hair and dander: Secondary | ICD-10-CM | POA: Diagnosis not present

## 2018-01-15 DIAGNOSIS — J3089 Other allergic rhinitis: Secondary | ICD-10-CM | POA: Diagnosis not present

## 2018-01-15 DIAGNOSIS — J301 Allergic rhinitis due to pollen: Secondary | ICD-10-CM | POA: Diagnosis not present

## 2018-01-15 DIAGNOSIS — J3081 Allergic rhinitis due to animal (cat) (dog) hair and dander: Secondary | ICD-10-CM | POA: Diagnosis not present

## 2018-01-19 DIAGNOSIS — J301 Allergic rhinitis due to pollen: Secondary | ICD-10-CM | POA: Diagnosis not present

## 2018-01-19 DIAGNOSIS — J3089 Other allergic rhinitis: Secondary | ICD-10-CM | POA: Diagnosis not present

## 2018-01-19 DIAGNOSIS — J3081 Allergic rhinitis due to animal (cat) (dog) hair and dander: Secondary | ICD-10-CM | POA: Diagnosis not present

## 2018-01-23 DIAGNOSIS — J3089 Other allergic rhinitis: Secondary | ICD-10-CM | POA: Diagnosis not present

## 2018-01-23 DIAGNOSIS — J301 Allergic rhinitis due to pollen: Secondary | ICD-10-CM | POA: Diagnosis not present

## 2018-01-23 DIAGNOSIS — J3081 Allergic rhinitis due to animal (cat) (dog) hair and dander: Secondary | ICD-10-CM | POA: Diagnosis not present

## 2018-01-26 ENCOUNTER — Emergency Department (HOSPITAL_COMMUNITY)
Admission: EM | Admit: 2018-01-26 | Discharge: 2018-01-27 | Disposition: A | Discharge status: Home / Self Care | Payer: BLUE CROSS/BLUE SHIELD | Attending: Emergency Medicine | Admitting: Emergency Medicine

## 2018-01-26 ENCOUNTER — Encounter (HOSPITAL_COMMUNITY): Payer: Self-pay

## 2018-01-26 DIAGNOSIS — S61211A Laceration without foreign body of left index finger without damage to nail, initial encounter: Secondary | ICD-10-CM

## 2018-01-26 DIAGNOSIS — Y9389 Activity, other specified: Secondary | ICD-10-CM | POA: Insufficient documentation

## 2018-01-26 DIAGNOSIS — Y929 Unspecified place or not applicable: Secondary | ICD-10-CM | POA: Diagnosis not present

## 2018-01-26 DIAGNOSIS — J45909 Unspecified asthma, uncomplicated: Secondary | ICD-10-CM | POA: Insufficient documentation

## 2018-01-26 DIAGNOSIS — W260XXA Contact with knife, initial encounter: Secondary | ICD-10-CM | POA: Insufficient documentation

## 2018-01-26 DIAGNOSIS — S61215A Laceration without foreign body of left ring finger without damage to nail, initial encounter: Secondary | ICD-10-CM | POA: Insufficient documentation

## 2018-01-26 DIAGNOSIS — Y999 Unspecified external cause status: Secondary | ICD-10-CM | POA: Insufficient documentation

## 2018-01-26 MED ORDER — LIDOCAINE-EPINEPHRINE-TETRACAINE (LET) SOLUTION
3.0000 mL | Freq: Once | NASAL | Status: AC
  Administered 2018-01-26: 3 mL via TOPICAL
  Filled 2018-01-26: qty 3

## 2018-01-26 NOTE — ED Triage Notes (Signed)
Pt reports that he was using a knife tonight and accidentally cut his L index finger, bleeding controlled, 1-2 cm lac, last tetanus one month ago

## 2018-01-26 NOTE — ED Provider Notes (Signed)
[facility] Provider Note   CSN: [id number] Arrival date & time: 01/26/18  2314     History   Chief Complaint Chief Complaint  Patient presents with  . Laceration    HPI Spencer Wade is a 42 y.o. male.  42yo male presents with complaint of left 3rd finger laceration. Patient was using a razorblade knife to cut something and accidentally cut his finger. Not on blood thinners, td is UTD. Bleeding controlled, no other injuries.      Past Medical History:  Diagnosis Date  . Anxiety   . Asthma   . Hx of adenomatous colonic polyps 06/14/2014    Patient Active Problem List   Diagnosis Date Noted  . Hx of adenomatous colonic polyps 06/14/2014    Past Surgical History:  Procedure Laterality Date  . BUNIONECTOMY Right         Home Medications    Prior to Admission medications   Medication Sig Start Date End Date Taking? Authorizing Provider  AMBULATORY NON FORMULARY MEDICATION Allergy injections Once weekly    [provider]  escitalopram (LEXAPRO) 10 MG tablet Take 10 mg by mouth at bedtime.  05/17/14   [provider]  temazepam (RESTORIL) 15 MG capsule Take 15 mg by mouth at bedtime.  04/22/14   [provider]    Family History Family History  Problem Relation Age of Onset  . Lung cancer Paternal Grandfather   . Alzheimer's disease Paternal Grandfather   . Lymphoma Paternal Grandmother   . Heart failure Paternal Grandmother   . Hypertension Paternal Grandmother   . Macular degeneration Paternal Grandmother   . Lung cancer Paternal Grandmother   . Stroke Paternal Grandmother   . Hypertension Father   . Colon polyps Father   . Hyperlipidemia Mother   . Thyroid disease Mother   . Colon polyps Sister   . Asthma Sister     Social History Social History   Tobacco Use  . Smoking status: Never Smoker  . Smokeless tobacco: Never Used  Substance Use Topics  . Alcohol use: Yes   Alcohol/week: 0.0 oz    Comment: wine with dinner several nights per week  . Drug use: No     Allergies   Patient has no known allergies.   Review of Systems Review of Systems  Constitutional: Positive for fever.  Musculoskeletal: Negative for arthralgias, joint swelling and myalgias.  Skin: Positive for wound.  Allergic/Immunologic: Negative for immunocompromised state.  Neurological: Negative for weakness and numbness.  Hematological: Does not bruise/bleed easily.  Psychiatric/Behavioral: Negative for self-injury.  All other systems reviewed and are negative.    Physical Exam Updated Vital Signs BP (!) 145/98 (BP Location: Right Arm)   Pulse 75   Temp 98.7 F (37.1 C) (Oral)   Resp 17   SpO2 97%   Physical Exam  Constitutional: He is oriented to person, place, and time. He appears well-developed and well-nourished. No distress.  HENT:  Head: Normocephalic and atraumatic.  Pulmonary/Chest: Effort normal.  Musculoskeletal: Normal range of motion. He exhibits no tenderness or deformity.       Hands: Neurological: He is alert and oriented to person, place, and time. No sensory deficit.  Skin: Skin is warm and dry. Capillary refill takes less than 2 seconds. He is not diaphoretic.  Psychiatric: He has a normal mood and affect. His behavior is normal.  Nursing note and vitals reviewed.    ED Treatments / Results  Labs (all labs  ordered are listed, but only abnormal results are displayed) Labs Reviewed - No data to display  EKG None  Radiology No results found.  Procedures .Marland KitchenLaceration Repair Date/Time: 01/26/2018 11:49 PM Performed by: Tacy Learn, PA-C Authorized by: Tacy Learn, PA-C   Consent:    Consent obtained:  Verbal   Consent given by:  Patient   Risks discussed:  Infection, need for additional repair, pain, poor cosmetic result and poor wound healing   Alternatives discussed:  No treatment and delayed treatment Universal protocol:     Procedure explained and questions answered to patient or proxy's satisfaction: yes     Immediately prior to procedure, a time out was called: yes     Patient identity confirmed:  Verbally with patient Anesthesia (see MAR for exact dosages):    Anesthesia method:  Topical application   Topical anesthetic:  LET Laceration details:    Location:  Finger   Finger location:  L index finger   Length (cm):  1   Depth (mm):  3 Repair type:    Repair type:  Simple Pre-procedure details:    Preparation:  Patient was prepped and draped in usual sterile fashion Exploration:    Hemostasis achieved with:  LET   Wound exploration: wound explored through full range of motion and entire depth of wound probed and visualized     Wound extent: no muscle damage noted, no nerve damage noted and no tendon damage noted     Contaminated: no   Treatment:    Area cleansed with:  Saline   Amount of cleaning:  Standard   Irrigation solution:  Sterile saline Skin repair:    Repair method:  Tissue adhesive Approximation:    Approximation:  Close Post-procedure details:    Dressing:  Splint for protection   Patient tolerance of procedure:  Tolerated well, no immediate complications   (including critical care time)  Medications Ordered in ED Medications  lidocaine-EPINEPHrine-tetracaine (LET) solution (3 mLs Topical Given 01/26/18 2347)     Initial Impression / Assessment and Plan / ED Course  I have reviewed the triage vital signs and the nursing notes.  Pertinent labs & imaging results that were available during my care of the patient were reviewed by me and considered in my medical decision making (see chart for details).  Clinical Course as of Jan 27 22  Tue Jan 27, 2018  0022 41yo male with laceration to left index finger. Patient is right hand dominant. Offered dermabond vs suture vs steristrip + splint. Patient elects for dermabond closure, understands need to keep joint straight, flexing joint  could result in wound reopening.    [LM]    Clinical Course User Index [LM] Tacy Learn, PA-C    Final Clinical Impressions(s) / ED Diagnoses   Final diagnoses:  Laceration of left index finger without foreign body without damage to nail, initial encounter    ED Discharge Orders    None       Tacy Learn, PA-C 01/27/18 0023    Little, Wenda Overland, MD 01/27/18 [phone]

## 2018-01-29 DIAGNOSIS — J3081 Allergic rhinitis due to animal (cat) (dog) hair and dander: Secondary | ICD-10-CM | POA: Diagnosis not present

## 2018-01-29 DIAGNOSIS — J301 Allergic rhinitis due to pollen: Secondary | ICD-10-CM | POA: Diagnosis not present

## 2018-01-29 DIAGNOSIS — J3089 Other allergic rhinitis: Secondary | ICD-10-CM | POA: Diagnosis not present

## 2018-02-10 DIAGNOSIS — J3089 Other allergic rhinitis: Secondary | ICD-10-CM | POA: Diagnosis not present

## 2018-02-10 DIAGNOSIS — J3081 Allergic rhinitis due to animal (cat) (dog) hair and dander: Secondary | ICD-10-CM | POA: Diagnosis not present

## 2018-02-10 DIAGNOSIS — J301 Allergic rhinitis due to pollen: Secondary | ICD-10-CM | POA: Diagnosis not present

## 2018-02-12 DIAGNOSIS — J029 Acute pharyngitis, unspecified: Secondary | ICD-10-CM | POA: Diagnosis not present

## 2018-02-12 DIAGNOSIS — J209 Acute bronchitis, unspecified: Secondary | ICD-10-CM | POA: Diagnosis not present

## 2018-02-16 DIAGNOSIS — J301 Allergic rhinitis due to pollen: Secondary | ICD-10-CM | POA: Diagnosis not present

## 2018-02-16 DIAGNOSIS — J3081 Allergic rhinitis due to animal (cat) (dog) hair and dander: Secondary | ICD-10-CM | POA: Diagnosis not present

## 2018-02-16 DIAGNOSIS — J3089 Other allergic rhinitis: Secondary | ICD-10-CM | POA: Diagnosis not present

## 2018-02-24 DIAGNOSIS — J3089 Other allergic rhinitis: Secondary | ICD-10-CM | POA: Diagnosis not present

## 2018-02-24 DIAGNOSIS — J3081 Allergic rhinitis due to animal (cat) (dog) hair and dander: Secondary | ICD-10-CM | POA: Diagnosis not present

## 2018-02-24 DIAGNOSIS — J301 Allergic rhinitis due to pollen: Secondary | ICD-10-CM | POA: Diagnosis not present

## 2018-03-02 DIAGNOSIS — J3089 Other allergic rhinitis: Secondary | ICD-10-CM | POA: Diagnosis not present

## 2018-03-02 DIAGNOSIS — J3081 Allergic rhinitis due to animal (cat) (dog) hair and dander: Secondary | ICD-10-CM | POA: Diagnosis not present

## 2018-03-02 DIAGNOSIS — J301 Allergic rhinitis due to pollen: Secondary | ICD-10-CM | POA: Diagnosis not present

## 2018-03-09 DIAGNOSIS — J301 Allergic rhinitis due to pollen: Secondary | ICD-10-CM | POA: Diagnosis not present

## 2018-03-09 DIAGNOSIS — J3089 Other allergic rhinitis: Secondary | ICD-10-CM | POA: Diagnosis not present

## 2018-03-09 DIAGNOSIS — J3081 Allergic rhinitis due to animal (cat) (dog) hair and dander: Secondary | ICD-10-CM | POA: Diagnosis not present

## 2018-03-30 DIAGNOSIS — J3089 Other allergic rhinitis: Secondary | ICD-10-CM | POA: Diagnosis not present

## 2018-03-30 DIAGNOSIS — J301 Allergic rhinitis due to pollen: Secondary | ICD-10-CM | POA: Diagnosis not present

## 2018-03-30 DIAGNOSIS — J3081 Allergic rhinitis due to animal (cat) (dog) hair and dander: Secondary | ICD-10-CM | POA: Diagnosis not present

## 2018-04-02 DIAGNOSIS — J301 Allergic rhinitis due to pollen: Secondary | ICD-10-CM | POA: Diagnosis not present

## 2018-04-02 DIAGNOSIS — J3081 Allergic rhinitis due to animal (cat) (dog) hair and dander: Secondary | ICD-10-CM | POA: Diagnosis not present

## 2018-04-03 DIAGNOSIS — J3089 Other allergic rhinitis: Secondary | ICD-10-CM | POA: Diagnosis not present

## 2018-04-06 DIAGNOSIS — J3081 Allergic rhinitis due to animal (cat) (dog) hair and dander: Secondary | ICD-10-CM | POA: Diagnosis not present

## 2018-04-06 DIAGNOSIS — J3089 Other allergic rhinitis: Secondary | ICD-10-CM | POA: Diagnosis not present

## 2018-04-06 DIAGNOSIS — J301 Allergic rhinitis due to pollen: Secondary | ICD-10-CM | POA: Diagnosis not present

## 2018-04-13 DIAGNOSIS — J3081 Allergic rhinitis due to animal (cat) (dog) hair and dander: Secondary | ICD-10-CM | POA: Diagnosis not present

## 2018-04-13 DIAGNOSIS — J3089 Other allergic rhinitis: Secondary | ICD-10-CM | POA: Diagnosis not present

## 2018-04-13 DIAGNOSIS — J301 Allergic rhinitis due to pollen: Secondary | ICD-10-CM | POA: Diagnosis not present

## 2018-04-27 DIAGNOSIS — J301 Allergic rhinitis due to pollen: Secondary | ICD-10-CM | POA: Diagnosis not present

## 2018-04-27 DIAGNOSIS — J3081 Allergic rhinitis due to animal (cat) (dog) hair and dander: Secondary | ICD-10-CM | POA: Diagnosis not present

## 2018-04-27 DIAGNOSIS — J3089 Other allergic rhinitis: Secondary | ICD-10-CM | POA: Diagnosis not present

## 2018-05-25 DIAGNOSIS — J3081 Allergic rhinitis due to animal (cat) (dog) hair and dander: Secondary | ICD-10-CM | POA: Diagnosis not present

## 2018-05-25 DIAGNOSIS — J301 Allergic rhinitis due to pollen: Secondary | ICD-10-CM | POA: Diagnosis not present

## 2018-05-25 DIAGNOSIS — J3089 Other allergic rhinitis: Secondary | ICD-10-CM | POA: Diagnosis not present

## 2018-06-26 DIAGNOSIS — Z0000 Encounter for general adult medical examination without abnormal findings: Secondary | ICD-10-CM | POA: Diagnosis not present

## 2018-06-26 DIAGNOSIS — Z125 Encounter for screening for malignant neoplasm of prostate: Secondary | ICD-10-CM | POA: Diagnosis not present

## 2018-06-29 DIAGNOSIS — Z6828 Body mass index (BMI) 28.0-28.9, adult: Secondary | ICD-10-CM | POA: Diagnosis not present

## 2018-06-29 DIAGNOSIS — Z0000 Encounter for general adult medical examination without abnormal findings: Secondary | ICD-10-CM | POA: Diagnosis not present

## 2018-06-29 DIAGNOSIS — Z1389 Encounter for screening for other disorder: Secondary | ICD-10-CM | POA: Diagnosis not present

## 2018-06-30 DIAGNOSIS — L700 Acne vulgaris: Secondary | ICD-10-CM | POA: Diagnosis not present

## 2018-06-30 DIAGNOSIS — D225 Melanocytic nevi of trunk: Secondary | ICD-10-CM | POA: Diagnosis not present

## 2018-06-30 DIAGNOSIS — L309 Dermatitis, unspecified: Secondary | ICD-10-CM | POA: Diagnosis not present

## 2018-07-16 DIAGNOSIS — J301 Allergic rhinitis due to pollen: Secondary | ICD-10-CM | POA: Diagnosis not present

## 2018-07-16 DIAGNOSIS — J3081 Allergic rhinitis due to animal (cat) (dog) hair and dander: Secondary | ICD-10-CM | POA: Diagnosis not present

## 2018-07-16 DIAGNOSIS — J3089 Other allergic rhinitis: Secondary | ICD-10-CM | POA: Diagnosis not present

## 2018-07-27 DIAGNOSIS — J3089 Other allergic rhinitis: Secondary | ICD-10-CM | POA: Diagnosis not present

## 2018-07-27 DIAGNOSIS — J3081 Allergic rhinitis due to animal (cat) (dog) hair and dander: Secondary | ICD-10-CM | POA: Diagnosis not present

## 2018-07-27 DIAGNOSIS — J301 Allergic rhinitis due to pollen: Secondary | ICD-10-CM | POA: Diagnosis not present

## 2018-08-03 DIAGNOSIS — J4520 Mild intermittent asthma, uncomplicated: Secondary | ICD-10-CM | POA: Diagnosis not present

## 2018-08-03 DIAGNOSIS — J3089 Other allergic rhinitis: Secondary | ICD-10-CM | POA: Diagnosis not present

## 2018-08-03 DIAGNOSIS — J301 Allergic rhinitis due to pollen: Secondary | ICD-10-CM | POA: Diagnosis not present

## 2018-08-03 DIAGNOSIS — J3081 Allergic rhinitis due to animal (cat) (dog) hair and dander: Secondary | ICD-10-CM | POA: Diagnosis not present

## 2018-08-10 DIAGNOSIS — J3081 Allergic rhinitis due to animal (cat) (dog) hair and dander: Secondary | ICD-10-CM | POA: Diagnosis not present

## 2018-08-10 DIAGNOSIS — J3089 Other allergic rhinitis: Secondary | ICD-10-CM | POA: Diagnosis not present

## 2018-08-10 DIAGNOSIS — J301 Allergic rhinitis due to pollen: Secondary | ICD-10-CM | POA: Diagnosis not present

## 2018-08-18 DIAGNOSIS — Z3009 Encounter for other general counseling and advice on contraception: Secondary | ICD-10-CM | POA: Diagnosis not present

## 2018-08-25 DIAGNOSIS — J3081 Allergic rhinitis due to animal (cat) (dog) hair and dander: Secondary | ICD-10-CM | POA: Diagnosis not present

## 2018-08-25 DIAGNOSIS — J301 Allergic rhinitis due to pollen: Secondary | ICD-10-CM | POA: Diagnosis not present

## 2018-08-25 DIAGNOSIS — J3089 Other allergic rhinitis: Secondary | ICD-10-CM | POA: Diagnosis not present

## 2018-09-08 ENCOUNTER — Other Ambulatory Visit: Payer: Self-pay

## 2018-09-08 DIAGNOSIS — B349 Viral infection, unspecified: Secondary | ICD-10-CM | POA: Diagnosis not present

## 2018-09-08 DIAGNOSIS — R05 Cough: Secondary | ICD-10-CM | POA: Diagnosis not present

## 2018-09-08 DIAGNOSIS — R6889 Other general symptoms and signs: Secondary | ICD-10-CM

## 2018-09-17 LAB — NOVEL CORONAVIRUS, NAA: SARS-CoV-2, NAA: NOT DETECTED

## 2018-09-21 ENCOUNTER — Telehealth (HOSPITAL_COMMUNITY): Payer: Self-pay | Admitting: Emergency Medicine

## 2018-09-21 NOTE — Telephone Encounter (Signed)
Attempted to reach patient. No answer at this time. Voicemail left.    

## 2018-09-21 NOTE — Telephone Encounter (Signed)
Patient contacted and made aware of all results, all questions answered.   

## 2018-10-09 DIAGNOSIS — Z302 Encounter for sterilization: Secondary | ICD-10-CM | POA: Diagnosis not present

## 2019-02-01 DIAGNOSIS — R05 Cough: Secondary | ICD-10-CM | POA: Diagnosis not present

## 2019-02-01 DIAGNOSIS — B349 Viral infection, unspecified: Secondary | ICD-10-CM | POA: Diagnosis not present

## 2019-02-01 DIAGNOSIS — J302 Other seasonal allergic rhinitis: Secondary | ICD-10-CM | POA: Diagnosis not present

## 2019-02-01 DIAGNOSIS — Z20818 Contact with and (suspected) exposure to other bacterial communicable diseases: Secondary | ICD-10-CM | POA: Diagnosis not present

## 2019-02-01 DIAGNOSIS — J45909 Unspecified asthma, uncomplicated: Secondary | ICD-10-CM | POA: Diagnosis not present

## 2019-02-22 DIAGNOSIS — L7682 Other postprocedural complications of skin and subcutaneous tissue: Secondary | ICD-10-CM | POA: Diagnosis not present

## 2019-04-01 DIAGNOSIS — Z23 Encounter for immunization: Secondary | ICD-10-CM | POA: Diagnosis not present

## 2019-04-07 DIAGNOSIS — F4322 Adjustment disorder with anxiety: Secondary | ICD-10-CM | POA: Diagnosis not present

## 2019-04-07 DIAGNOSIS — F439 Reaction to severe stress, unspecified: Secondary | ICD-10-CM | POA: Diagnosis not present

## 2019-05-04 DIAGNOSIS — F439 Reaction to severe stress, unspecified: Secondary | ICD-10-CM | POA: Diagnosis not present

## 2019-05-04 DIAGNOSIS — F4322 Adjustment disorder with anxiety: Secondary | ICD-10-CM | POA: Diagnosis not present

## 2019-05-25 DIAGNOSIS — F439 Reaction to severe stress, unspecified: Secondary | ICD-10-CM | POA: Diagnosis not present

## 2019-05-25 DIAGNOSIS — F4322 Adjustment disorder with anxiety: Secondary | ICD-10-CM | POA: Diagnosis not present

## 2019-06-03 DIAGNOSIS — F4322 Adjustment disorder with anxiety: Secondary | ICD-10-CM | POA: Diagnosis not present

## 2019-06-03 DIAGNOSIS — F439 Reaction to severe stress, unspecified: Secondary | ICD-10-CM | POA: Diagnosis not present

## 2019-06-29 ENCOUNTER — Encounter: Payer: Self-pay | Admitting: Internal Medicine

## 2019-06-30 DIAGNOSIS — F439 Reaction to severe stress, unspecified: Secondary | ICD-10-CM | POA: Diagnosis not present

## 2019-06-30 DIAGNOSIS — F4322 Adjustment disorder with anxiety: Secondary | ICD-10-CM | POA: Diagnosis not present

## 2019-07-01 ENCOUNTER — Ambulatory Visit: Payer: Managed Care, Other (non HMO) | Attending: Internal Medicine

## 2019-07-01 DIAGNOSIS — Z20822 Contact with and (suspected) exposure to covid-19: Secondary | ICD-10-CM

## 2019-07-03 LAB — NOVEL CORONAVIRUS, NAA: SARS-CoV-2, NAA: NOT DETECTED

## 2019-07-12 ENCOUNTER — Encounter: Payer: Self-pay | Admitting: Internal Medicine

## 2019-07-13 ENCOUNTER — Telehealth: Payer: Self-pay | Admitting: Internal Medicine

## 2019-07-13 NOTE — Telephone Encounter (Signed)
LM on vmail to call back to schedule his recall colonoscopy per Dr. Gessner's recall assessment 

## 2019-07-14 DIAGNOSIS — L700 Acne vulgaris: Secondary | ICD-10-CM | POA: Diagnosis not present

## 2019-07-14 DIAGNOSIS — D225 Melanocytic nevi of trunk: Secondary | ICD-10-CM | POA: Diagnosis not present

## 2019-07-14 DIAGNOSIS — Z86018 Personal history of other benign neoplasm: Secondary | ICD-10-CM | POA: Diagnosis not present

## 2019-07-14 DIAGNOSIS — D485 Neoplasm of uncertain behavior of skin: Secondary | ICD-10-CM | POA: Diagnosis not present

## 2019-07-14 DIAGNOSIS — D2261 Melanocytic nevi of right upper limb, including shoulder: Secondary | ICD-10-CM | POA: Diagnosis not present

## 2019-07-19 DIAGNOSIS — Z20828 Contact with and (suspected) exposure to other viral communicable diseases: Secondary | ICD-10-CM | POA: Diagnosis not present

## 2019-07-19 DIAGNOSIS — U071 COVID-19: Secondary | ICD-10-CM | POA: Diagnosis not present

## 2019-07-19 DIAGNOSIS — Z03818 Encounter for observation for suspected exposure to other biological agents ruled out: Secondary | ICD-10-CM | POA: Diagnosis not present

## 2019-07-20 ENCOUNTER — Other Ambulatory Visit: Payer: Managed Care, Other (non HMO)

## 2019-07-27 ENCOUNTER — Encounter: Payer: Self-pay | Admitting: Internal Medicine

## 2019-08-03 ENCOUNTER — Other Ambulatory Visit: Payer: Self-pay

## 2019-08-03 ENCOUNTER — Ambulatory Visit (AMBULATORY_SURGERY_CENTER): Payer: Self-pay

## 2019-08-03 VITALS — Temp 97.4°F | Ht 70.0 in

## 2019-08-03 DIAGNOSIS — Z86010 Personal history of colonic polyps: Secondary | ICD-10-CM

## 2019-08-03 NOTE — Progress Notes (Signed)
No egg or soy allergy known to patient  No issues with past sedation with any surgeries  or procedures, no intubation problems  No diet pills per patient No home 02 use per patient  No blood thinners per patient  Pt denies issues with constipation  No A fib or A flutter  EMMI video sent to pt's e mail   No covid test due to pt testing positive 07/12/19  Due to the COVID-19 pandemic we are asking patients to follow these guidelines. Please only bring one care partner. Please be aware that your care partner may wait in the car in the parking lot or if they feel like they will be too hot to wait in the car, they may wait in the lobby on the 4th floor. All care partners are required to wear a mask the entire time (we do not have any that we can provide them), they need to practice social distancing, and we will do a Covid check for all patient's and care partners when you arrive. Also we will check their temperature and your temperature. If the care partner waits in their car they need to stay in the parking lot the entire time and we will call them on their cell phone when the patient is ready for discharge so they can bring the car to the front of the building. Also all patient's will need to wear a mask into building.

## 2019-08-06 DIAGNOSIS — Z0000 Encounter for general adult medical examination without abnormal findings: Secondary | ICD-10-CM | POA: Diagnosis not present

## 2019-08-06 DIAGNOSIS — R7989 Other specified abnormal findings of blood chemistry: Secondary | ICD-10-CM | POA: Diagnosis not present

## 2019-08-06 DIAGNOSIS — Z125 Encounter for screening for malignant neoplasm of prostate: Secondary | ICD-10-CM | POA: Diagnosis not present

## 2019-08-09 DIAGNOSIS — Z1212 Encounter for screening for malignant neoplasm of rectum: Secondary | ICD-10-CM | POA: Diagnosis not present

## 2019-08-09 DIAGNOSIS — R82998 Other abnormal findings in urine: Secondary | ICD-10-CM | POA: Diagnosis not present

## 2019-08-13 ENCOUNTER — Encounter: Payer: Managed Care, Other (non HMO) | Admitting: Internal Medicine

## 2019-08-16 ENCOUNTER — Encounter: Payer: Self-pay | Admitting: Internal Medicine

## 2019-08-19 ENCOUNTER — Ambulatory Visit (AMBULATORY_SURGERY_CENTER): Payer: BC Managed Care – PPO | Admitting: Internal Medicine

## 2019-08-19 ENCOUNTER — Encounter: Payer: Self-pay | Admitting: Internal Medicine

## 2019-08-19 ENCOUNTER — Other Ambulatory Visit: Payer: Self-pay

## 2019-08-19 VITALS — BP 116/75 | HR 61 | Temp 97.3°F | Resp 20 | Ht 70.0 in | Wt 190.0 lb

## 2019-08-19 DIAGNOSIS — Z86010 Personal history of colonic polyps: Secondary | ICD-10-CM | POA: Diagnosis not present

## 2019-08-19 DIAGNOSIS — D123 Benign neoplasm of transverse colon: Secondary | ICD-10-CM | POA: Diagnosis not present

## 2019-08-19 DIAGNOSIS — Z1211 Encounter for screening for malignant neoplasm of colon: Secondary | ICD-10-CM | POA: Diagnosis not present

## 2019-08-19 DIAGNOSIS — K621 Rectal polyp: Secondary | ICD-10-CM | POA: Diagnosis not present

## 2019-08-19 DIAGNOSIS — D128 Benign neoplasm of rectum: Secondary | ICD-10-CM | POA: Diagnosis not present

## 2019-08-19 MED ORDER — SODIUM CHLORIDE 0.9 % IV SOLN: 500.0000 mL | Freq: Once | INTRAVENOUS | Status: DC

## 2019-08-19 NOTE — Patient Instructions (Addendum)
I found and removed 2 tiny polyps again today. Look benign - will get them analyzed.  I will let you know pathology results and when to have another routine colonoscopy by mail and/or My Chart.  I appreciate the opportunity to care for you. Gatha Mayer, MD, [facility]   Handout on polyps given.  YOU HAD AN ENDOSCOPIC PROCEDURE TODAY AT [facility]:   Refer to the procedure report that was given to you for any specific questions about what was found during the examination.  If the procedure report does not answer your questions, please call your gastroenterologist to clarify.  If you requested that your care partner not be given the details of your procedure findings, then the procedure report has been included in a sealed envelope for you to review at your convenience later.  YOU SHOULD EXPECT: Some feelings of bloating in the abdomen. Passage of more gas than usual.  Walking can help get rid of the air that was put into your GI tract during the procedure and reduce the bloating. If you had a lower endoscopy (such as a colonoscopy or flexible sigmoidoscopy) you may notice spotting of blood in your stool or on the toilet paper. If you underwent a bowel prep for your procedure, you may not have a normal bowel movement for a few days.  Please Note:  You might notice some irritation and congestion in your nose or some drainage.  This is from the oxygen used during your procedure.  There is no need for concern and it should clear up in a day or so.  SYMPTOMS TO REPORT IMMEDIATELY:   Following lower endoscopy (colonoscopy or flexible sigmoidoscopy):  Excessive amounts of blood in the stool  Significant tenderness or worsening of abdominal pains  Swelling of the abdomen that is new, acute  Fever of 100F or higher   For urgent or emergent issues, a gastroenterologist can be reached at any hour by calling [phone].   DIET:  We do recommend a small meal at first, but then you  may proceed to your regular diet.  Drink plenty of fluids but you should avoid alcoholic beverages for 24 hours.  ACTIVITY:  You should plan to take it easy for the rest of today and you should NOT DRIVE or use heavy machinery until tomorrow (because of the sedation medicines used during the test).    FOLLOW UP: Our staff will call the number listed on your records 48-72 hours following your procedure to check on you and address any questions or concerns that you may have regarding the information given to you following your procedure. If we do not reach you, we will leave a message.  We will attempt to reach you two times.  During this call, we will ask if you have developed any symptoms of COVID 19. If you develop any symptoms (ie: fever, flu-like symptoms, shortness of breath, cough etc.) before then, please call [phone].  If you test positive for Covid 19 in the 2 weeks post procedure, please call and report this information to [place].    If any biopsies were taken you will be contacted by phone or by letter within the next 1-3 weeks.  Please call us at [phone] if you have not heard about the biopsies in 3 weeks.    SIGNATURES/CONFIDENTIALITY: You and/or your care partner have signed paperwork which will be entered into your electronic medical record.  These signatures attest to the fact that that  the information above on your After Visit Summary has been reviewed and is understood.  Full responsibility of the confidentiality of this discharge information lies with you and/or your care-partner.

## 2019-08-19 NOTE — Progress Notes (Signed)
Called to room to assist during endoscopic procedure.  Patient ID and intended procedure confirmed with present staff. Received instructions for my participation in the procedure from the performing physician.  

## 2019-08-19 NOTE — Progress Notes (Signed)
Report given to PACU, vss 

## 2019-08-19 NOTE — Op Note (Signed)
[facility] Patient Name: Spencer Wade Procedure Date: 08/19/2019 1:26 PM MRN: [id number] Endoscopist: Gatha Mayer , MD Age: 44 Referring MD:  Date of Birth: 1975/11/03 Gender: Male Account #: [id number] Procedure:                Colonoscopy Indications:              Surveillance: Personal history of adenomatous                            polyps on last colonoscopy 5 years ago Medicines:                Propofol per Anesthesia, Monitored Anesthesia Care Procedure:                Pre-Anesthesia Assessment:                           - Prior to the procedure, a History and Physical                            was performed, and patient medications and                            allergies were reviewed. The patient's tolerance of                            previous anesthesia was also reviewed. The risks                            and benefits of the procedure and the sedation                            options and risks were discussed with the patient.                            All questions were answered, and informed consent                            was obtained. Prior Anticoagulants: The patient has                            taken no previous anticoagulant or antiplatelet                            agents. ASA Grade Assessment: I - A normal, healthy                            patient. After reviewing the risks and benefits,                            the patient was deemed in satisfactory condition to                            undergo the procedure.  After obtaining informed consent, the colonoscope                            was passed under direct vision. Throughout the                            procedure, the patient's blood pressure, pulse, and                            oxygen saturations were monitored continuously. The                            Colonoscope was introduced through the anus and                            advanced to the  the cecum, identified by                            appendiceal orifice and ileocecal valve. The                            colonoscopy was performed without difficulty. The                            patient tolerated the procedure well. The quality                            of the bowel preparation was good. The bowel                            preparation used was Miralax via split dose                            instruction. The ileocecal valve, appendiceal                            orifice, and rectum were photographed. Scope In: 1:30:53 PM Scope Out: 1:48:45 PM Scope Withdrawal Time: 0 hours 14 minutes 27 seconds  Total Procedure Duration: 0 hours 17 minutes 52 seconds  Findings:                 The perianal and digital rectal examinations were                            normal. Pertinent negatives include normal prostate                            (size, shape, and consistency).                           Two sessile polyps were found in the rectum and                            transverse colon. The polyps were diminutive in  size. These polyps were removed with a cold snare.                            Resection and retrieval were complete. Verification                            of patient identification for the specimen was                            done. Estimated blood loss was minimal.                           The exam was otherwise without abnormality on                            direct and retroflexion views. Complications:            No immediate complications. Estimated Blood Loss:     Estimated blood loss was minimal. Impression:               - Two diminutive polyps in the rectum and in the                            transverse colon, removed with a cold snare.                            Resected and retrieved.                           - The examination was otherwise normal on direct                            and retroflexion views.                            - Personal history of colonic polyps. 2 diminutive                            adenomas 2015, also father and sister w/ polyps Recommendation:           - Patient has a contact number available for                            emergencies. The signs and symptoms of potential                            delayed complications were discussed with the                            patient. Return to normal activities tomorrow.                            Written discharge instructions were provided to the  patient.                           - Resume previous diet.                           - Continue present medications.                           - Repeat colonoscopy is recommended for                            surveillance. The colonoscopy date will be                            determined after pathology results from today's                            exam become available for review. Gatha Mayer, MD 08/19/2019 1:56:44 PM This report has been signed electronically.

## 2019-08-23 ENCOUNTER — Telehealth: Payer: Self-pay

## 2019-08-23 NOTE — Telephone Encounter (Signed)
  Follow up Call-  Call back number 08/19/2019  Post procedure Call Back phone  # [phone]  Permission to leave phone message Yes  Some recent data might be hidden     Patient questions:  Do you have a fever, pain , or abdominal swelling? No. Pain Score  0 *  Have you tolerated food without any problems? Yes.    Have you been able to return to your normal activities? Yes.    Do you have any questions about your discharge instructions: Diet   No. Medications  No. Follow up visit  No.  Do you have questions or concerns about your Care? No.  Actions: * If pain score is 4 or above: No action needed, pain <4.  1. Have you developed a fever since your procedure? no  2.   Have you had an respiratory symptoms (SOB or cough) since your procedure? no  3.   Have you tested positive for COVID 19 since your procedure no  4.   Have you had any family members/close contacts diagnosed with the COVID 19 since your procedure?  no   If yes to any of these questions please route to Joylene John, RN and Alphonsa Gin, Therapist, sports.

## 2019-08-24 ENCOUNTER — Encounter: Payer: Self-pay | Admitting: Internal Medicine

## 2019-08-24 DIAGNOSIS — Z86010 Personal history of colonic polyps: Secondary | ICD-10-CM

## 2019-08-25 DIAGNOSIS — Z0000 Encounter for general adult medical examination without abnormal findings: Secondary | ICD-10-CM | POA: Diagnosis not present

## 2019-08-25 DIAGNOSIS — H00019 Hordeolum externum unspecified eye, unspecified eyelid: Secondary | ICD-10-CM | POA: Diagnosis not present

## 2019-08-25 DIAGNOSIS — G4733 Obstructive sleep apnea (adult) (pediatric): Secondary | ICD-10-CM | POA: Diagnosis not present

## 2019-08-25 DIAGNOSIS — Z86010 Personal history of colonic polyps: Secondary | ICD-10-CM | POA: Diagnosis not present

## 2019-08-25 DIAGNOSIS — Z1331 Encounter for screening for depression: Secondary | ICD-10-CM | POA: Diagnosis not present

## 2019-08-25 DIAGNOSIS — R030 Elevated blood-pressure reading, without diagnosis of hypertension: Secondary | ICD-10-CM | POA: Diagnosis not present

## 2019-08-26 DIAGNOSIS — H0012 Chalazion right lower eyelid: Secondary | ICD-10-CM | POA: Diagnosis not present

## 2019-08-30 DIAGNOSIS — H0012 Chalazion right lower eyelid: Secondary | ICD-10-CM | POA: Diagnosis not present

## 2020-03-07 ENCOUNTER — Ambulatory Visit (INDEPENDENT_AMBULATORY_CARE_PROVIDER_SITE_OTHER): Payer: BC Managed Care – PPO | Admitting: Neurology

## 2020-03-07 ENCOUNTER — Encounter: Payer: Self-pay | Admitting: Neurology

## 2020-03-07 VITALS — BP 126/84 | HR 55 | Ht 71.0 in | Wt 196.0 lb

## 2020-03-07 DIAGNOSIS — G478 Other sleep disorders: Secondary | ICD-10-CM

## 2020-03-07 DIAGNOSIS — R0683 Snoring: Secondary | ICD-10-CM | POA: Diagnosis not present

## 2020-03-07 NOTE — Progress Notes (Signed)
SLEEP MEDICINE CLINIC    Provider:  Larey Seat, MD  Primary Care Physician:  Velna Hatchet, [place] [address]     Referring Provider: Velna Hatchet, [place],  [place] [address]          Chief Complaint according to patient   Patient presents with:    . New Patient (Initial Visit)           HISTORY OF PRESENT ILLNESS:  Spencer Wade is a 44 year old  Caucasian male patient seen here as a referral on 03/07/2020 .   Chief concern according to patient :   Mr. Lynnette Caffey used to live in the [place] area about 11 years ago when he was evaluated by Dr. Tracie Harrier for the possible presence of sleep apnea, he underwent home sleep testing was diagnosed with mild apnea and continue to use his CPAP for several years.  In the meantime he has many times moved in his machine would be a decade old by now.  He has not used it in several years. He now has 46 year old twins, and his wife has noted him being tired , snoring louder.    He has a past medical history of Allergy, Anxiety, Asthma, and adenomatous colonic polyps (06/14/2014).   Family medical /sleep history: father likely has OSA, is a loud snorer and has hypersomnia- and no sleep walkers. father had REM sleep behavior about 3 years ago.    Social history:  Patient is working as and lives in a household with spouse and 42 year old twins, and expecting a third child 03-16-20 .  The patient currently works as a Music therapist. Pets are present, dog  Tobacco use: never,  ETOH use ; socially 4 times a week.  Caffeine intake in form of Coffee( 2 cups a day ). Regular exercise in form of gym, personal trainer, cardi with [employer].      Sleep habits are as follows: The patient's dinner time is between 5-6 PM. Kids go to bed at 8, The patient goes to bed at 10 PM and continues to sleep for 7-8 hours, he sometimes wakes for one bathroom break.   The preferred sleep position is supine  , with the  support of 1 pillow. He is not snoring when on his side.  Dreams are reportedly frequent.  6 AM is the usual rise time. The patient wakes up by the baby monitor - 5.30 AM. A nanny is present in daytime.  He reports not feeling as refreshed or restored in AM, as he used to.   There are symptoms such as dry mouth, morning headaches  and residual fatigue.  Naps are taken infrequently. He takes melatonin at night.  This improved duration of sleep. Brother in law made him a dental device.    Review of Systems: Out of a complete 14 system review, the patient complains of only the following symptoms, and all other reviewed systems are negative.:  Fatigue, sleepiness , snoring, some fragmented sleep, Insomnia before melatonin.    How likely are you to doze in the following situations: 0 = not likely, 1 = slight chance, 2 = moderate chance, 3 = high chance   Sitting and Reading? Watching Television? Sitting inactive in a public place (theater or meeting)? As a passenger in a car for an hour without a break? Lying down in the afternoon when circumstances permit? Sitting and talking to someone? Sitting quietly after  lunch without alcohol? In a car, while stopped for a few minutes in traffic?   Total = 9/ 24 points   FSS endorsed at 21/ 63 points.   Social History   Socioeconomic History  . Marital status: Married    Spouse name: Not on file  . Number of children: 0  . Years of education: Not on file  . Highest education level: Not on file  Occupational History  . Occupation: Music therapist  Tobacco Use  . Smoking status: Never Smoker  . Smokeless tobacco: Never Used  Vaping Use  . Vaping Use: Never used  Substance and Sexual Activity  . Alcohol use: Yes    Alcohol/week: 0.0 standard drinks    Comment: wine with dinner several nights per week  . Drug use: No  . Sexual activity: Not on file  Other Topics Concern  . Not on file  Social History Narrative   05/24/2014 updated    Currently separated, he is a Music therapist, one caffeinated beverage daily.   Social Determinants of Health   Financial Resource Strain:   . Difficulty of Paying Living Expenses: Not on file  Food Insecurity:   . Worried About Charity fundraiser in the Last Year: Not on file  . Ran Out of Food in the Last Year: Not on file  Transportation Needs:   . Lack of Transportation (Medical): Not on file  . Lack of Transportation (Non-Medical): Not on file  Physical Activity:   . Days of Exercise per Week: Not on file  . Minutes of Exercise per Session: Not on file  Stress:   . Feeling of Stress : Not on file  Social Connections:   . Frequency of Communication with Friends and Family: Not on file  . Frequency of Social Gatherings with Friends and Family: Not on file  . Attends Religious Services: Not on file  . Active Member of Clubs or Organizations: Not on file  . Attends Archivist Meetings: Not on file  . Marital Status: Not on file    Family History  Problem Relation Age of Onset  . Lung cancer Paternal Grandfather   . Alzheimer's disease Paternal Grandfather   . Lymphoma Paternal Grandmother   . Heart failure Paternal Grandmother   . Hypertension Paternal Grandmother   . Macular degeneration Paternal Grandmother   . Lung cancer Paternal Grandmother   . Stroke Paternal Grandmother   . Hypertension Father   . Colon polyps Father   . Hyperlipidemia Mother   . Thyroid disease Mother   . Colon polyps Sister   . Asthma Sister   . Colon cancer Neg Hx   . Esophageal cancer Neg Hx   . Rectal cancer Neg Hx   . Stomach cancer Neg Hx     Past Medical History:  Diagnosis Date  . Allergy    seasonal pollen  . Anxiety   . Asthma   . Hx of adenomatous colonic polyps 06/14/2014    Past Surgical History:  Procedure Laterality Date  . BUNIONECTOMY Right   . COLONOSCOPY    . COLONOSCOPY W/ POLYPECTOMY  2015  . POLYPECTOMY       Current Outpatient Medications on  File Prior to Visit  Medication Sig Dispense Refill  . COENZYME Q10 PO Take 100 mg by mouth. Daily    . glucosamine-chondroitin 500-400 MG tablet Take 1 tablet by mouth daily.    . Multiple Vitamins-Minerals (MENS MULTIVITAMIN PLUS PO) Take 1 capsule by mouth  daily.    . Omega-3 Fatty Acids (FISH OIL) 500 MG CAPS Take 1 capsule by mouth 2 (two) times daily.    . Probiotic Product (PROBIOTIC-10 PO) Take 1 tablet by mouth daily.     No current facility-administered medications on file prior to visit.    Physical exam:  Today's Vitals   03/07/20 1541  BP: 126/84  Pulse: (!) 55  Weight: 196 lb (88.9 kg)  Height: 5\' 11"  (1.803 m)   Body mass index is 27.34 kg/m.   Wt Readings from Last 3 Encounters:  03/07/20 196 lb (88.9 kg)  08/19/19 190 lb (86.2 kg)  06/09/14 198 lb (89.8 kg)     Ht Readings from Last 3 Encounters:  03/07/20 5\' 11"  (1.803 m)  08/19/19 5\' 10"  (1.778 m)  08/03/19 5\' 10"  (1.778 m)      General: The patient is awake, alert and appears not in acute distress. The patient is well groomed. Head: Normocephalic, atraumatic. Neck is supple. Mallampati 2 ,  neck circumference:16. 5 inches . Nasal airflow patent.  Retrognathia is not  seen.  Dental status: intact  Cardiovascular:  Regular rate and cardiac rhythm by pulse,  without distended neck veins. Respiratory: Lungs are clear to auscultation.  Skin:  Without evidence of ankle edema, or rash. Trunk: The patient's posture is erect.   Neurologic exam : The patient is awake and alert, oriented to place and time.   Memory subjective described as intact.  Attention span & concentration ability appears normal.  Speech is fluent,  without  dysarthria, dysphonia or aphasia.  Mood and affect are appropriate.   Cranial nerves: no loss of smell or taste reported  Pupils are equal and briskly reactive to light. Funduscopic exam deferred, .  Extraocular movements in vertical and horizontal planes were intact and without  nystagmus. No Diplopia. Visual fields by finger perimetry are intact. Hearing was intact .    Facial sensation intact to fine touch.  Facial motor strength is symmetric and tongue and uvula move midline.  Neck ROM : rotation, tilt and flexion extension were normal for age and shoulder shrug was symmetrical.    Motor exam:  Symmetric bulk, tone and ROM.   Normal tone , symmetric grip strength . Sensory:  Fine touch, vibration were intact.     Coordination: Rapid alternating movements in the fingers/hands were of normal speed.  The Finger-to-nose maneuver was intact without evidence of ataxia, dysmetria or tremor.   Gait and station: Patient could rise unassisted from a seated position, walked without assistive device.  Stance is of normal width/ base and the patient turned with 3 steps.  Toe and heel walk were deferred.  Deep tendon reflexes: in the upper and lower extremities are symmetric and intact.  Babinski response was deferred.       After spending a total time of 35  minutes face to face and additional time for physical and neurologic examination, review of laboratory studies,  personal review of imaging studies, reports and results of other testing and review of referral information / records as far as provided in visit, I have established the following assessments:  1) I think that Mr. Biegel earlier sleep complaints at the beginning of this year, were likely related to g his infant twins changing sleeping habits.  In the meantime children have kept a longer sleep time, he gets woken up in the morning but it is not a long time before he would leave anyway.  So in  some regards living with his young and newborn children is always causing parents to have sleep deprivation to some degree.  Usually there is more anxiety and more hypervigilance associated with sleep while being a caretaker.  But I would like to check if the presence of sleep apnea since we know that Mr. Lynnette Caffey is  snoring.  This seems to be for the snoring at least a strong positional component and his wife has noted that he does not snore when he sleeps on his side.  So sometimes behavior training alone and assuming a different sleep position can be treatment in itself.  I would like for him to either undergo a home sleep test again or an attended sleep study is probably not feasible to get him into the sleep lab now that the newborn is soon joining the family.  My goal is to not necessarily treat him with CPAP if the sleep apnea is mild and positional dependent.  If he does have REM sleep dependent sleep apnea associated with more events during dream sleep, he may need to resume CPAP treatment.  I will keep Dr. Augustin Coupe and of course the patient informed about the progress of the work-up.   My Plan is to proceed with:   I would like to thank Velna Hatchet, [place],  [place] [phone] for allowing me to meet with and to take care of this pleasant patient.   Electronically signed by: Larey Seat, MD 03/07/2020 4:00 PM  [facility] Board certified by The [employer] of Sleep Medicine and Diplomate of the [employer] of Sleep Medicine. Board certified In Neurology through the [employer], Fellow of the [employer] of Neurology. Medical Director of [facility].

## 2020-03-07 NOTE — Patient Instructions (Signed)

## 2020-04-03 ENCOUNTER — Other Ambulatory Visit: Payer: Self-pay

## 2020-04-03 ENCOUNTER — Ambulatory Visit (INDEPENDENT_AMBULATORY_CARE_PROVIDER_SITE_OTHER): Payer: BC Managed Care – PPO | Admitting: Neurology

## 2020-04-03 DIAGNOSIS — G4733 Obstructive sleep apnea (adult) (pediatric): Secondary | ICD-10-CM

## 2020-04-03 DIAGNOSIS — R0683 Snoring: Secondary | ICD-10-CM

## 2020-04-03 DIAGNOSIS — G478 Other sleep disorders: Secondary | ICD-10-CM

## 2020-04-12 DIAGNOSIS — R0683 Snoring: Secondary | ICD-10-CM | POA: Insufficient documentation

## 2020-04-12 DIAGNOSIS — G4733 Obstructive sleep apnea (adult) (pediatric): Secondary | ICD-10-CM | POA: Insufficient documentation

## 2020-04-12 DIAGNOSIS — G478 Other sleep disorders: Secondary | ICD-10-CM | POA: Insufficient documentation

## 2020-04-12 NOTE — Addendum Note (Signed)
Addended by: Larey Seat on: 04/12/2020 05:54 PM   Modules accepted: Orders

## 2020-04-12 NOTE — Procedures (Addendum)
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 Tracie Harrier for the possible presence of sleep apnea, he underwent home sleep testing and was subsequently diagnosed with mild apnea, started CPAP and continued to use his CPAP for several years before he lost his machine in a move.  - his machine would be a decade old by now.   He has not used CPAP in several years. He now has 2 -year- old twins, and his wife is expecting again, she has noted him being much more tired, witnessed his snoring becoming louder.  He has a past medical history of Allergy, Anxiety, Asthma, and adenomatous colonic polyps (06/14/2014).    Summary & Diagnosis:     This HST only documented a surprisingly mild degree of sleep apnea at AHI 7/h. REM sleep AHI was 17.3/h and RDI, which reflects snoring, was 14.4/h.  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     Recommendations:      REM dependent apnea is responding to PAP therapy, less so to dental devices or inspire device.  I would recommend CPAP at low pressure settings, just enough to overcome snoring, and used with a mask of patient's choice and heated humidification. Settings form 5-12 cm water, 2 cm EPR.   Interpreting Physician: Jeb Levering, MD           Sleep Summary  Oxygen Saturation Statistics   Start Study Time: End Study Time: Total Recording Time:          10:46:24 PM 5:32:46 AM   6 h, 46 min  Total Sleep Time % REM of Sleep Time:  6 h, 2  min  28.8    Mean: 95 Minimum: 89 Maximum: 98  Mean of Desaturations Nadirs (%):   92  Oxygen Desaturation. %: 4-9 10-20 >20 Total  Events Number Total  11 100.0  0 0.0  0 0.0  11 100.0  Oxygen Saturation: <90 <=88 <85 <80 <70  Duration (minutes): Sleep % 0.1 0.0 0.0 0.0 0.0 0.0 0.0 0.0 0.0 0.0     Respiratory Indices      Total Events REM NREM All Night  pRDI: pAHI 3%: ODI 4%: pAHIc 3%: % CSR: pAHI 4%:  87  42  11  0 0.0 15 27.1 17.3 4.0 0.0 9.3 2.8 0.9 0.0 14.4 7.0 1.8 0.0 2.5       Pulse Rate Statistics during Sleep (BPM)      Mean: 53 Minimum: 40 Maximum: 82    Indices are calculated using technically valid sleep time of 6 h.        5              15                    30            pAHI=7.0  Mild              Moderate                    Severe                Body Position Statistics  Position Supine Prone Right Left Non-Supine  Sleep (min) 362.4 0.0 0.0 0.0 0.0  Sleep % 100.0 0.0 0.0 0.0 0.0  pRDI 14.4 N/A N/A N/A N/A  pAHI 3% 7.0 N/A N/A N/A N/A  ODI 4% 1.8 N/A N/A N/A N/A      Supine    Snoring Statistics Snoring Level (dB) >40 >50 >60 >70 >80 >Threshold (45)  Sleep (min) 100.9 3.3 1.2 0.0 0.0 5.7  Sleep % 27.8 0.9 0.3 0.0 0.0 1.6    Mean: 41 dB

## 2020-04-12 NOTE — Progress Notes (Signed)
Summary & Diagnosis:   This HST only documented a surprisingly mild degree of sleep  apnea at AHI 7/h. REM sleep AHI was 17.3/h and RDI, which  reflects snoring, was 14.4/h.  This finding is indicative of REM dependent mild sleep apnea with  upper airway resistance syndrome.     Recommendations:     REM dependent apnea is responding to PAP therapy, less so to  dental devices or inspire device.  I would recommend CPAP at low pressure settings, just enough to  overcome snoring, and used with a mask of patient's choice and  heated humidification. Pressure settings from 5-12 cm water, 2 cm EPR.   Interpreting Physician: Jeb Levering, MD

## 2020-04-17 ENCOUNTER — Telehealth: Payer: Self-pay | Admitting: Neurology

## 2020-04-17 NOTE — Telephone Encounter (Signed)
I called pt. I advised pt that Dr. Brett Fairy reviewed their sleep study results and found that pt mild sleep apnea. Dr. Brett Fairy recommends that pt starts auto CPAP. I reviewed PAP compliance expectations with the pt. Pt is agreeable to starting a CPAP. I advised pt that an order will be sent to a DME, [facility], and [facility] will call the pt within about one week after they file with the pt's insurance. [facility] will show the pt how to use the machine, fit for masks, and troubleshoot the CPAP if needed. A follow up appt was made for insurance purposes with Ward Givens, NP on Jan 27,2022 at 10 am. Pt verbalized understanding to arrive 15 minutes early and bring their CPAP. A letter with all of this information in it will be mailed to the pt as a reminder. I verified with the pt that the address we have on file is correct. Pt verbalized understanding of results. Pt had no questions at this time but was encouraged to call back if questions arise. I have sent the order to [facility] and have received confirmation that they have received the order.

## 2020-04-17 NOTE — Telephone Encounter (Signed)
-----   Message from Larey Seat, MD sent at 04/12/2020  5:54 PM EDT ----- Summary & Diagnosis:   This HST only documented a surprisingly mild degree of sleep  apnea at AHI 7/h. REM sleep AHI was 17.3/h and RDI, which  reflects snoring, was 14.4/h.  This finding is indicative of REM dependent mild sleep apnea with  upper airway resistance syndrome.     Recommendations:     REM dependent apnea is responding to PAP therapy, less so to  dental devices or inspire device.  I would recommend CPAP at low pressure settings, just enough to  overcome snoring, and used with a mask of patient's choice and  heated humidification. Pressure settings from 5-12 cm water, 2 cm EPR.   Interpreting Physician: Jeb Levering, MD

## 2020-06-26 ENCOUNTER — Other Ambulatory Visit: Payer: BC Managed Care – PPO

## 2020-06-26 DIAGNOSIS — Z20822 Contact with and (suspected) exposure to covid-19: Secondary | ICD-10-CM | POA: Diagnosis not present

## 2020-06-28 LAB — NOVEL CORONAVIRUS, NAA: SARS-CoV-2, NAA: DETECTED — AB

## 2020-06-28 LAB — SARS-COV-2, NAA 2 DAY TAT

## 2020-07-20 ENCOUNTER — Ambulatory Visit: Payer: Self-pay | Admitting: Adult Health

## 2020-08-25 DIAGNOSIS — Z125 Encounter for screening for malignant neoplasm of prostate: Secondary | ICD-10-CM | POA: Diagnosis not present

## 2020-08-25 DIAGNOSIS — E785 Hyperlipidemia, unspecified: Secondary | ICD-10-CM | POA: Diagnosis not present

## 2020-08-29 ENCOUNTER — Other Ambulatory Visit: Payer: Self-pay | Admitting: Internal Medicine

## 2020-08-29 DIAGNOSIS — Z0000 Encounter for general adult medical examination without abnormal findings: Secondary | ICD-10-CM | POA: Diagnosis not present

## 2020-08-29 DIAGNOSIS — G4700 Insomnia, unspecified: Secondary | ICD-10-CM | POA: Diagnosis not present

## 2020-09-04 DIAGNOSIS — Z20822 Contact with and (suspected) exposure to covid-19: Secondary | ICD-10-CM | POA: Diagnosis not present

## 2020-09-04 DIAGNOSIS — Z03818 Encounter for observation for suspected exposure to other biological agents ruled out: Secondary | ICD-10-CM | POA: Diagnosis not present

## 2020-09-14 DIAGNOSIS — F4322 Adjustment disorder with anxiety: Secondary | ICD-10-CM | POA: Diagnosis not present

## 2020-09-15 ENCOUNTER — Ambulatory Visit
Admission: RE | Admit: 2020-09-15 | Discharge: 2020-09-15 | Disposition: A | Discharge status: Home / Self Care | Payer: No Typology Code available for payment source | Source: Ambulatory Visit | Attending: Internal Medicine | Admitting: Internal Medicine

## 2020-09-15 DIAGNOSIS — Z0000 Encounter for general adult medical examination without abnormal findings: Secondary | ICD-10-CM

## 2020-09-28 DIAGNOSIS — G4733 Obstructive sleep apnea (adult) (pediatric): Secondary | ICD-10-CM | POA: Diagnosis not present

## 2020-10-09 DIAGNOSIS — S92414A Nondisplaced fracture of proximal phalanx of right great toe, initial encounter for closed fracture: Secondary | ICD-10-CM | POA: Diagnosis not present

## 2020-10-10 DIAGNOSIS — F4322 Adjustment disorder with anxiety: Secondary | ICD-10-CM | POA: Diagnosis not present

## 2020-10-11 DIAGNOSIS — S92411A Displaced fracture of proximal phalanx of right great toe, initial encounter for closed fracture: Secondary | ICD-10-CM | POA: Diagnosis not present

## 2020-10-17 DIAGNOSIS — F4322 Adjustment disorder with anxiety: Secondary | ICD-10-CM | POA: Diagnosis not present

## 2020-10-22 DIAGNOSIS — G4733 Obstructive sleep apnea (adult) (pediatric): Secondary | ICD-10-CM | POA: Diagnosis not present

## 2020-10-24 DIAGNOSIS — F4322 Adjustment disorder with anxiety: Secondary | ICD-10-CM | POA: Diagnosis not present

## 2020-10-31 DIAGNOSIS — F4322 Adjustment disorder with anxiety: Secondary | ICD-10-CM | POA: Diagnosis not present

## 2020-11-07 DIAGNOSIS — F4322 Adjustment disorder with anxiety: Secondary | ICD-10-CM | POA: Diagnosis not present

## 2020-11-10 DIAGNOSIS — S92411D Displaced fracture of proximal phalanx of right great toe, subsequent encounter for fracture with routine healing: Secondary | ICD-10-CM | POA: Diagnosis not present

## 2020-11-15 DIAGNOSIS — F4322 Adjustment disorder with anxiety: Secondary | ICD-10-CM | POA: Diagnosis not present

## 2020-11-21 ENCOUNTER — Encounter: Payer: Self-pay | Admitting: Adult Health

## 2020-11-21 ENCOUNTER — Ambulatory Visit: Payer: Self-pay | Admitting: Adult Health

## 2020-11-22 DIAGNOSIS — G4733 Obstructive sleep apnea (adult) (pediatric): Secondary | ICD-10-CM | POA: Diagnosis not present

## 2020-12-22 DIAGNOSIS — G4733 Obstructive sleep apnea (adult) (pediatric): Secondary | ICD-10-CM | POA: Diagnosis not present

## 2021-08-01 DIAGNOSIS — G4733 Obstructive sleep apnea (adult) (pediatric): Secondary | ICD-10-CM | POA: Diagnosis not present

## 2021-09-03 DIAGNOSIS — E785 Hyperlipidemia, unspecified: Secondary | ICD-10-CM | POA: Diagnosis not present

## 2021-09-03 DIAGNOSIS — Z125 Encounter for screening for malignant neoplasm of prostate: Secondary | ICD-10-CM | POA: Diagnosis not present

## 2021-09-11 DIAGNOSIS — Z23 Encounter for immunization: Secondary | ICD-10-CM | POA: Diagnosis not present

## 2021-09-11 DIAGNOSIS — Z0000 Encounter for general adult medical examination without abnormal findings: Secondary | ICD-10-CM | POA: Diagnosis not present

## 2021-09-11 DIAGNOSIS — R911 Solitary pulmonary nodule: Secondary | ICD-10-CM | POA: Diagnosis not present

## 2021-09-11 DIAGNOSIS — G4700 Insomnia, unspecified: Secondary | ICD-10-CM | POA: Diagnosis not present

## 2021-09-11 DIAGNOSIS — Z1339 Encounter for screening examination for other mental health and behavioral disorders: Secondary | ICD-10-CM | POA: Diagnosis not present

## 2021-09-11 DIAGNOSIS — Z1331 Encounter for screening for depression: Secondary | ICD-10-CM | POA: Diagnosis not present

## 2022-02-19 DIAGNOSIS — Z319 Encounter for procreative management, unspecified: Secondary | ICD-10-CM | POA: Diagnosis not present

## 2022-02-19 DIAGNOSIS — Z113 Encounter for screening for infections with a predominantly sexual mode of transmission: Secondary | ICD-10-CM | POA: Diagnosis not present

## 2022-04-01 DIAGNOSIS — H0014 Chalazion left upper eyelid: Secondary | ICD-10-CM | POA: Diagnosis not present

## 2022-04-04 DIAGNOSIS — H0014 Chalazion left upper eyelid: Secondary | ICD-10-CM | POA: Diagnosis not present

## 2022-06-13 DIAGNOSIS — H10411 Chronic giant papillary conjunctivitis, right eye: Secondary | ICD-10-CM | POA: Diagnosis not present

## 2022-07-10 IMAGING — CT CT CARDIAC CORONARY ARTERY CALCIUM SCORE
3 series · 12 of 20 positions shown, 14 images · non-contrast
Comparison: None.

CLINICAL DATA: 44-year-old white male and screening examination.

EXAM:
CT CARDIAC CORONARY ARTERY CALCIUM SCORE
TECHNIQUE: Non-contrast imaging through the heart was performed using
prospective ECG gating. Image post processing was performed on an
independent workstation, allowing for quantitative analysis of the
heart and coronary arteries. Note that this exam targets the heart
and the chest was not imaged in its entirety.

[Series 2: calcium scoring 2.00 qr36 bestdiast 69% hrt calciu · axial · 0.42mm/px · z∈[+1730,+1762]mm · 2 of 80 slices shown]
[im 16/80  vessel]
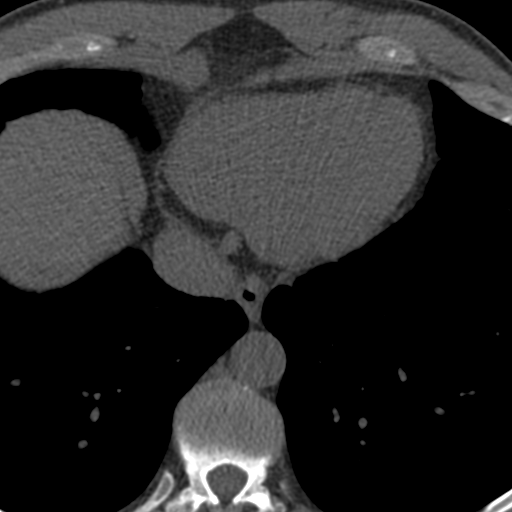
[im 32/80  vessel]
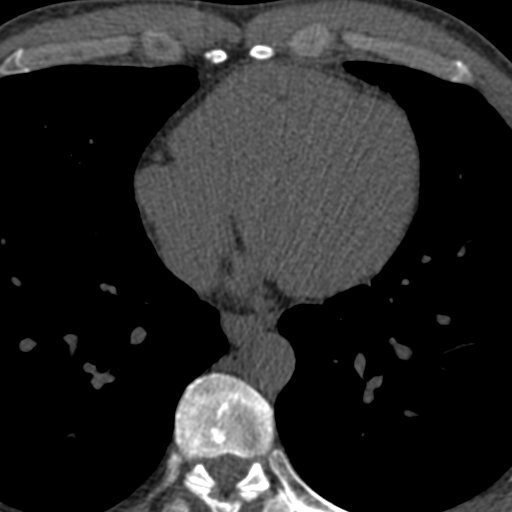

[Series 3: calcium scoring 2.00 br40 bestdiast 69% axial · axial · 0.58mm/px · z∈[+1726,+1830]mm · 5 of 80 slices shown, 7 images]
[im 14/80  vessel]
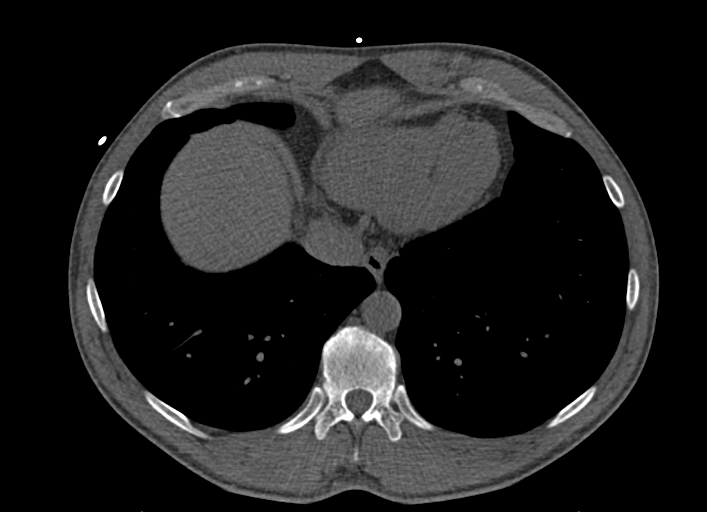
[im 14/80  lung]
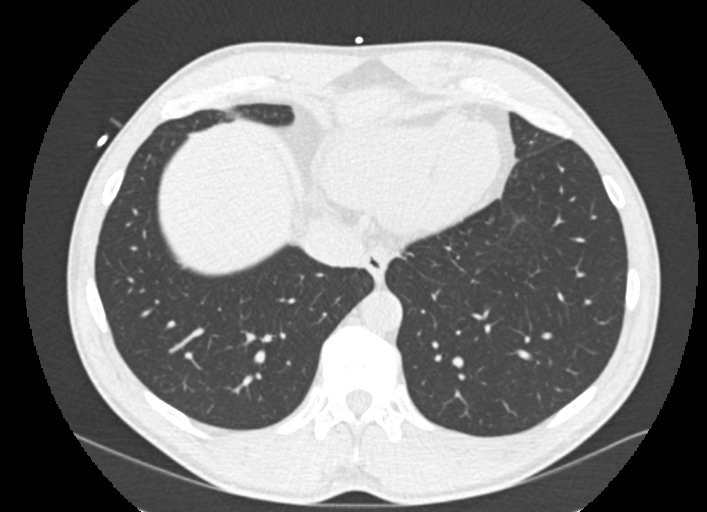
[im 27/80  vessel]
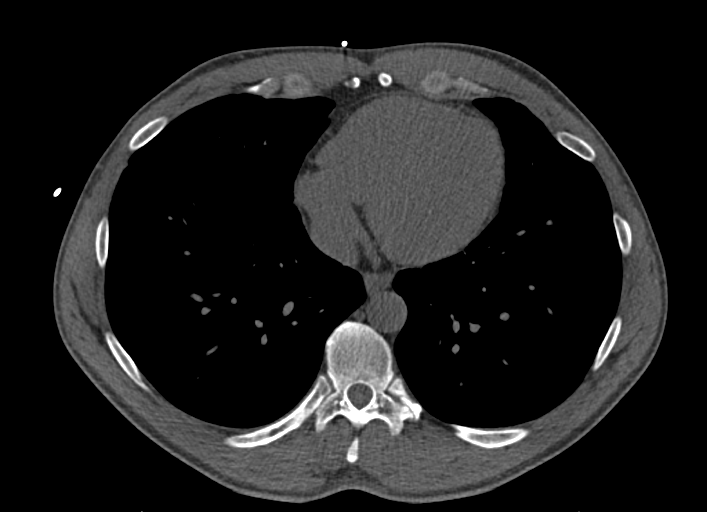
[im 40/80  vessel]
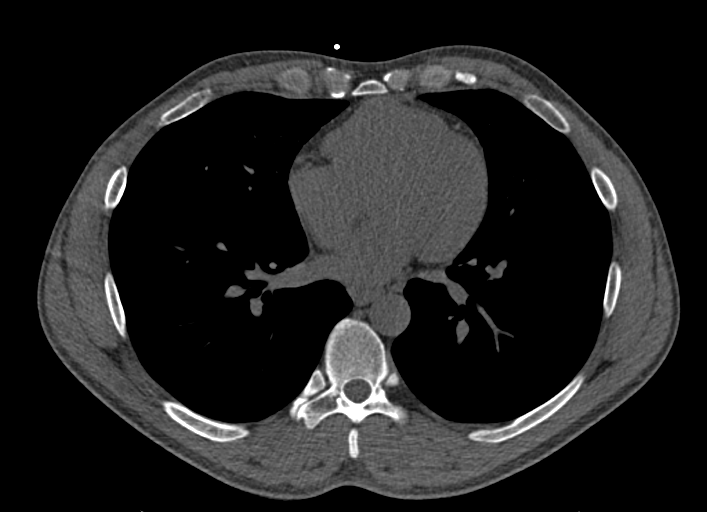
[im 53/80  vessel]
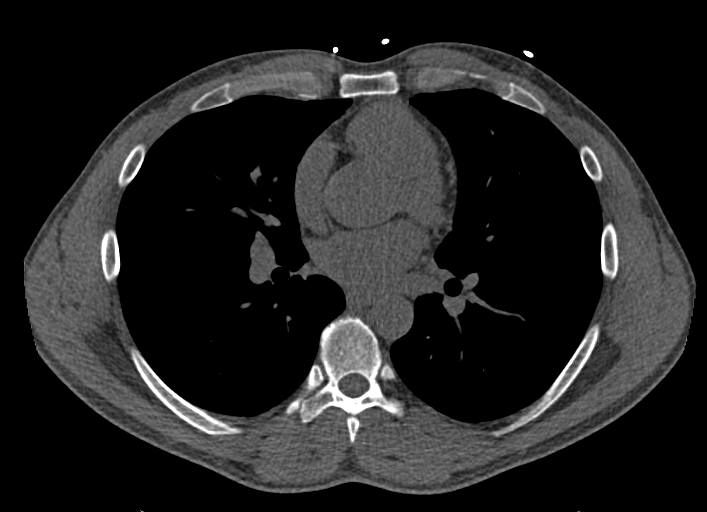
[im 66/80  vessel]
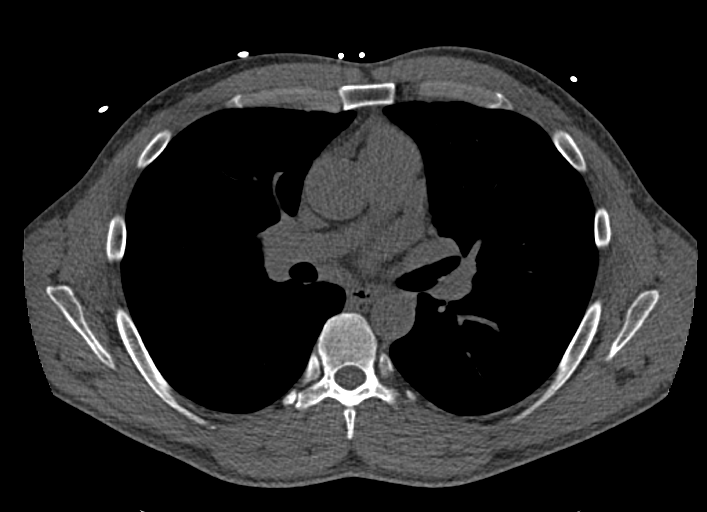
[im 66/80  lung]
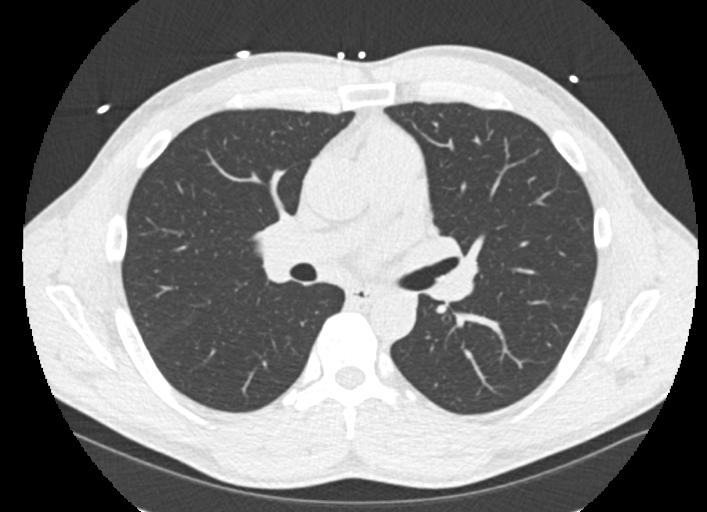

[Series 9: calcium scoring 2.00 br60 bestdiast 69% lungs · axial · 0.58mm/px · z∈[+1726,+1830]mm · 5 of 80 slices shown]
[im 14/80  vessel]
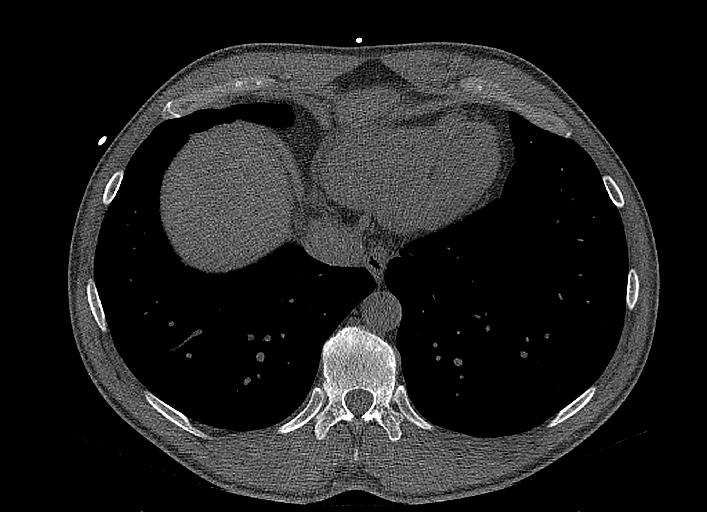
[im 27/80  vessel]
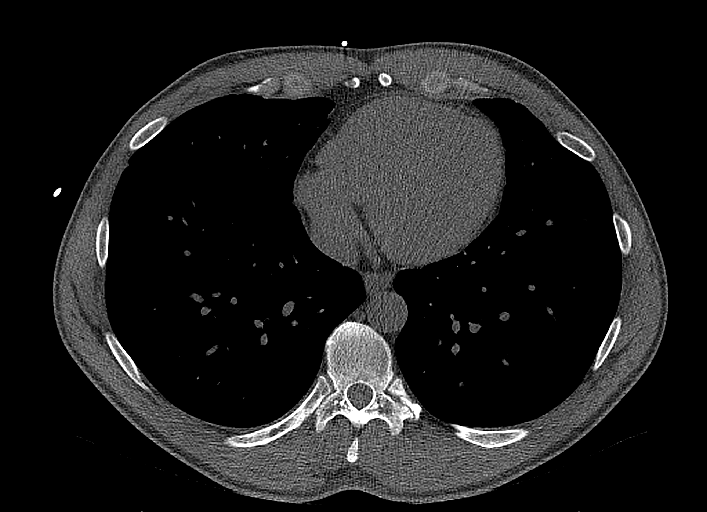
[im 40/80  vessel]
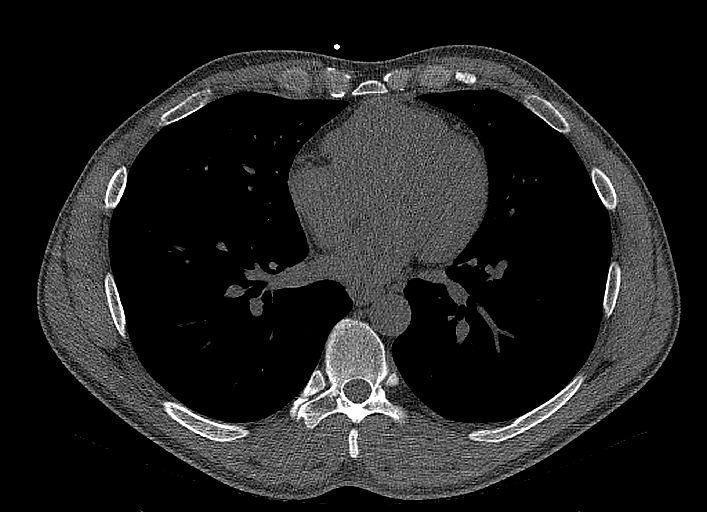
[im 53/80  vessel]
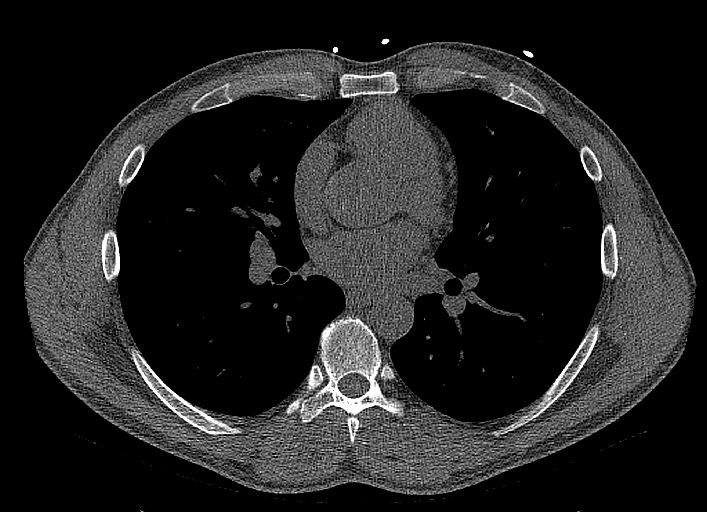
[im 66/80  vessel]
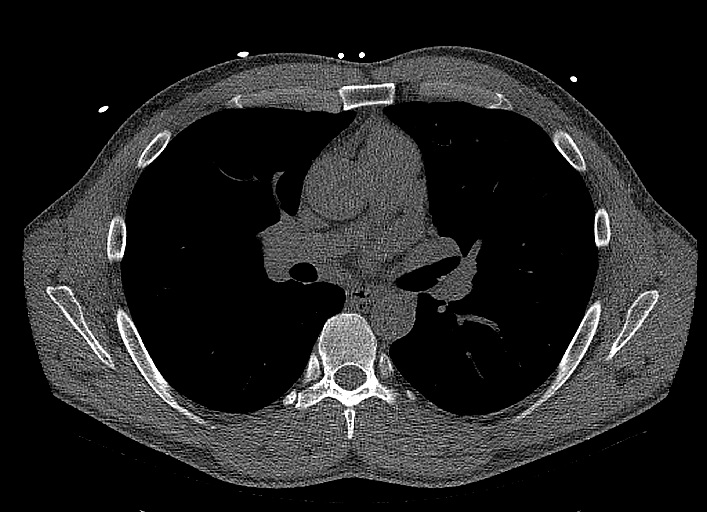

[12 of 20 positions shown; findings below may reference images not displayed]

FINDINGS: CORONARY CALCIUM SCORES:

Left Main: 0

LAD: 0

LCx: 0

RCA:

Total Agatston Score:

[HOSPITAL] percentile: 73

AORTA MEASUREMENTS:

Ascending Aorta: 37 mm

Descending Aorta: 27 mm

OTHER FINDINGS:

Heart size is normal. No significant pericardial effusion. Mild
fullness in the subcarinal tissue measuring 1.3 cm in short axis and
this is nonspecific. Overall, no significant lymphadenopathy in the
visualized mediastinum. Visualized abdomen is unremarkable. No large
pleural effusions. 5 mm nodular density along the left major fissure
on sequence 9, image 59. Configuration of this nodule is suggestive
for a peri-fissural lymph node. No significant airspace disease or
consolidation in the visualized lungs. No acute bone abnormality.
IMPRESSION: 1. Coronary calcium score is 1.64 and this is at percentile 73 for
patients of the same age, gender and ethnicity.
2. 5 mm nodular density along the left major fissure has the
configuration of a peri fissural lymph node and likely an incidental
finding. No follow-up needed if patient is low-risk. Non-contrast
chest CT can be considered in 12 months if patient is high-risk.
This recommendation follows the consensus statement: Guidelines for
Management of Incidental Pulmonary Nodules Detected on CT Images:

## 2022-09-23 DIAGNOSIS — E785 Hyperlipidemia, unspecified: Secondary | ICD-10-CM | POA: Diagnosis not present

## 2022-09-23 DIAGNOSIS — Z0000 Encounter for general adult medical examination without abnormal findings: Secondary | ICD-10-CM | POA: Diagnosis not present

## 2022-09-23 DIAGNOSIS — I2584 Coronary atherosclerosis due to calcified coronary lesion: Secondary | ICD-10-CM | POA: Diagnosis not present

## 2022-10-01 DIAGNOSIS — I2584 Coronary atherosclerosis due to calcified coronary lesion: Secondary | ICD-10-CM | POA: Diagnosis not present

## 2022-10-01 DIAGNOSIS — G4733 Obstructive sleep apnea (adult) (pediatric): Secondary | ICD-10-CM | POA: Diagnosis not present

## 2022-10-01 DIAGNOSIS — Z0000 Encounter for general adult medical examination without abnormal findings: Secondary | ICD-10-CM | POA: Diagnosis not present

## 2022-10-01 DIAGNOSIS — Z1331 Encounter for screening for depression: Secondary | ICD-10-CM | POA: Diagnosis not present

## 2022-10-01 DIAGNOSIS — Z1339 Encounter for screening examination for other mental health and behavioral disorders: Secondary | ICD-10-CM | POA: Diagnosis not present

## 2022-10-01 DIAGNOSIS — F418 Other specified anxiety disorders: Secondary | ICD-10-CM | POA: Diagnosis not present

## 2022-10-01 DIAGNOSIS — G4700 Insomnia, unspecified: Secondary | ICD-10-CM | POA: Diagnosis not present

## 2022-12-24 DIAGNOSIS — M25512 Pain in left shoulder: Secondary | ICD-10-CM | POA: Diagnosis not present

## 2022-12-24 DIAGNOSIS — M19011 Primary osteoarthritis, right shoulder: Secondary | ICD-10-CM | POA: Diagnosis not present

## 2022-12-24 DIAGNOSIS — S43431A Superior glenoid labrum lesion of right shoulder, initial encounter: Secondary | ICD-10-CM | POA: Diagnosis not present

## 2023-01-08 DIAGNOSIS — M19011 Primary osteoarthritis, right shoulder: Secondary | ICD-10-CM | POA: Diagnosis not present

## 2023-08-08 DIAGNOSIS — M79671 Pain in right foot: Secondary | ICD-10-CM | POA: Diagnosis not present

## 2023-08-08 DIAGNOSIS — M67961 Unspecified disorder of synovium and tendon, right lower leg: Secondary | ICD-10-CM | POA: Diagnosis not present

## 2023-08-08 DIAGNOSIS — M25571 Pain in right ankle and joints of right foot: Secondary | ICD-10-CM | POA: Diagnosis not present

## 2023-08-28 DIAGNOSIS — M25571 Pain in right ankle and joints of right foot: Secondary | ICD-10-CM | POA: Diagnosis not present

## 2023-08-28 DIAGNOSIS — M67961 Unspecified disorder of synovium and tendon, right lower leg: Secondary | ICD-10-CM | POA: Diagnosis not present

## 2023-10-07 DIAGNOSIS — Z125 Encounter for screening for malignant neoplasm of prostate: Secondary | ICD-10-CM | POA: Diagnosis not present

## 2023-10-07 DIAGNOSIS — E785 Hyperlipidemia, unspecified: Secondary | ICD-10-CM | POA: Diagnosis not present

## 2023-10-13 DIAGNOSIS — Z1331 Encounter for screening for depression: Secondary | ICD-10-CM | POA: Diagnosis not present

## 2023-10-13 DIAGNOSIS — G4733 Obstructive sleep apnea (adult) (pediatric): Secondary | ICD-10-CM | POA: Diagnosis not present

## 2023-10-13 DIAGNOSIS — Z0000 Encounter for general adult medical examination without abnormal findings: Secondary | ICD-10-CM | POA: Diagnosis not present

## 2023-10-13 DIAGNOSIS — Z1339 Encounter for screening examination for other mental health and behavioral disorders: Secondary | ICD-10-CM | POA: Diagnosis not present

## 2023-10-23 ENCOUNTER — Other Ambulatory Visit: Payer: Self-pay | Admitting: Internal Medicine

## 2023-10-23 DIAGNOSIS — I2510 Atherosclerotic heart disease of native coronary artery without angina pectoris: Secondary | ICD-10-CM

## 2023-11-13 DIAGNOSIS — M25571 Pain in right ankle and joints of right foot: Secondary | ICD-10-CM | POA: Diagnosis not present

## 2023-11-19 ENCOUNTER — Ambulatory Visit
Admission: RE | Admit: 2023-11-19 | Discharge: 2023-11-19 | Disposition: A | Discharge status: Home / Self Care | Source: Ambulatory Visit | Attending: Internal Medicine | Admitting: Internal Medicine

## 2023-11-19 DIAGNOSIS — I2510 Atherosclerotic heart disease of native coronary artery without angina pectoris: Secondary | ICD-10-CM

## 2023-12-01 DIAGNOSIS — M76821 Posterior tibial tendinitis, right leg: Secondary | ICD-10-CM | POA: Diagnosis not present

## 2023-12-01 DIAGNOSIS — M6701 Short Achilles tendon (acquired), right ankle: Secondary | ICD-10-CM | POA: Diagnosis not present

## 2024-02-03 DIAGNOSIS — S93421A Sprain of deltoid ligament of right ankle, initial encounter: Secondary | ICD-10-CM | POA: Diagnosis not present

## 2024-02-03 DIAGNOSIS — M2141 Flat foot [pes planus] (acquired), right foot: Secondary | ICD-10-CM | POA: Diagnosis not present

## 2024-02-03 DIAGNOSIS — M76821 Posterior tibial tendinitis, right leg: Secondary | ICD-10-CM | POA: Diagnosis not present

## 2024-02-03 DIAGNOSIS — M7671 Peroneal tendinitis, right leg: Secondary | ICD-10-CM | POA: Diagnosis not present

## 2024-02-03 DIAGNOSIS — X58XXXA Exposure to other specified factors, initial encounter: Secondary | ICD-10-CM | POA: Diagnosis not present

## 2024-02-03 DIAGNOSIS — M6701 Short Achilles tendon (acquired), right ankle: Secondary | ICD-10-CM | POA: Diagnosis not present

## 2024-02-03 DIAGNOSIS — M216X1 Other acquired deformities of right foot: Secondary | ICD-10-CM | POA: Diagnosis not present

## 2024-02-03 DIAGNOSIS — Y999 Unspecified external cause status: Secondary | ICD-10-CM | POA: Diagnosis not present

## 2024-02-03 DIAGNOSIS — M66271 Spontaneous rupture of extensor tendons, right ankle and foot: Secondary | ICD-10-CM | POA: Diagnosis not present

## 2024-02-03 DIAGNOSIS — G8918 Other acute postprocedural pain: Secondary | ICD-10-CM | POA: Diagnosis not present

## 2024-02-18 DIAGNOSIS — M67961 Unspecified disorder of synovium and tendon, right lower leg: Secondary | ICD-10-CM | POA: Diagnosis not present

## 2024-02-20 DIAGNOSIS — Z4789 Encounter for other orthopedic aftercare: Secondary | ICD-10-CM | POA: Diagnosis not present

## 2024-03-17 DIAGNOSIS — M67961 Unspecified disorder of synovium and tendon, right lower leg: Secondary | ICD-10-CM | POA: Diagnosis not present

## 2024-03-17 DIAGNOSIS — M79671 Pain in right foot: Secondary | ICD-10-CM | POA: Diagnosis not present

## 2024-04-15 DIAGNOSIS — M67961 Unspecified disorder of synovium and tendon, right lower leg: Secondary | ICD-10-CM | POA: Diagnosis not present

## 2024-05-03 DIAGNOSIS — M79671 Pain in right foot: Secondary | ICD-10-CM | POA: Diagnosis not present

## 2024-05-03 DIAGNOSIS — R269 Unspecified abnormalities of gait and mobility: Secondary | ICD-10-CM | POA: Diagnosis not present

## 2024-05-14 DIAGNOSIS — M67961 Unspecified disorder of synovium and tendon, right lower leg: Secondary | ICD-10-CM | POA: Diagnosis not present

## 2024-05-14 DIAGNOSIS — M79671 Pain in right foot: Secondary | ICD-10-CM | POA: Diagnosis not present

## 2024-05-18 DIAGNOSIS — R269 Unspecified abnormalities of gait and mobility: Secondary | ICD-10-CM | POA: Diagnosis not present

## 2024-05-18 DIAGNOSIS — M79671 Pain in right foot: Secondary | ICD-10-CM | POA: Diagnosis not present

## 2024-06-01 DIAGNOSIS — M25571 Pain in right ankle and joints of right foot: Secondary | ICD-10-CM | POA: Diagnosis not present

## 2024-06-22 DIAGNOSIS — M79671 Pain in right foot: Secondary | ICD-10-CM | POA: Diagnosis not present

## 2024-06-22 DIAGNOSIS — R269 Unspecified abnormalities of gait and mobility: Secondary | ICD-10-CM | POA: Diagnosis not present
